# Patient Record
Sex: Male | Born: 1960 | Race: White | Hispanic: No | Smoking: Former smoker
Health system: Southern US, Community
[De-identification: ages and names within clinical notes are randomized; demographics above are authoritative.]

## PROBLEM LIST (undated history)

## (undated) DIAGNOSIS — M5416 Radiculopathy, lumbar region: Secondary | ICD-10-CM

## (undated) DIAGNOSIS — K219 Gastro-esophageal reflux disease without esophagitis: Secondary | ICD-10-CM

## (undated) DIAGNOSIS — Z8719 Personal history of other diseases of the digestive system: Secondary | ICD-10-CM

## (undated) DIAGNOSIS — M722 Plantar fascial fibromatosis: Secondary | ICD-10-CM

## (undated) DIAGNOSIS — M47816 Spondylosis without myelopathy or radiculopathy, lumbar region: Secondary | ICD-10-CM

## (undated) DIAGNOSIS — M1990 Unspecified osteoarthritis, unspecified site: Secondary | ICD-10-CM

## (undated) DIAGNOSIS — M5126 Other intervertebral disc displacement, lumbar region: Secondary | ICD-10-CM

## (undated) DIAGNOSIS — C801 Malignant (primary) neoplasm, unspecified: Secondary | ICD-10-CM

## (undated) DIAGNOSIS — Z860100 Personal history of colon polyps, unspecified: Secondary | ICD-10-CM

## (undated) DIAGNOSIS — E669 Obesity, unspecified: Secondary | ICD-10-CM

## (undated) DIAGNOSIS — G4730 Sleep apnea, unspecified: Secondary | ICD-10-CM

## (undated) DIAGNOSIS — J45909 Unspecified asthma, uncomplicated: Secondary | ICD-10-CM

## (undated) DIAGNOSIS — E538 Deficiency of other specified B group vitamins: Secondary | ICD-10-CM

## (undated) DIAGNOSIS — R0600 Dyspnea, unspecified: Secondary | ICD-10-CM

## (undated) DIAGNOSIS — E785 Hyperlipidemia, unspecified: Secondary | ICD-10-CM

## (undated) DIAGNOSIS — J302 Other seasonal allergic rhinitis: Secondary | ICD-10-CM

## (undated) DIAGNOSIS — M51369 Other intervertebral disc degeneration, lumbar region without mention of lumbar back pain or lower extremity pain: Secondary | ICD-10-CM

## (undated) DIAGNOSIS — I499 Cardiac arrhythmia, unspecified: Secondary | ICD-10-CM

## (undated) DIAGNOSIS — Z85810 Personal history of malignant neoplasm of tongue: Secondary | ICD-10-CM

## (undated) DIAGNOSIS — I739 Peripheral vascular disease, unspecified: Secondary | ICD-10-CM

## (undated) HISTORY — DX: Unspecified osteoarthritis, unspecified site: M19.90

## (undated) HISTORY — DX: Personal history of malignant neoplasm of tongue: Z85.810

## (undated) HISTORY — PX: BILATERAL CARPAL TUNNEL RELEASE: SHX6508

## (undated) HISTORY — DX: Gastro-esophageal reflux disease without esophagitis: K21.9

## (undated) HISTORY — DX: Unspecified asthma, uncomplicated: J45.909

---

## 2004-09-11 ENCOUNTER — Ambulatory Visit: Payer: Self-pay | Admitting: Surgery

## 2006-12-19 ENCOUNTER — Ambulatory Visit: Payer: Self-pay | Admitting: Unknown Physician Specialty

## 2007-02-11 ENCOUNTER — Ambulatory Visit: Payer: Self-pay | Admitting: Unknown Physician Specialty

## 2007-02-27 ENCOUNTER — Ambulatory Visit: Payer: Self-pay | Admitting: Unknown Physician Specialty

## 2010-05-17 ENCOUNTER — Ambulatory Visit: Payer: Self-pay | Admitting: Unknown Physician Specialty

## 2011-08-30 ENCOUNTER — Ambulatory Visit: Payer: Self-pay | Admitting: Unknown Physician Specialty

## 2011-09-02 LAB — PATHOLOGY REPORT

## 2012-06-15 ENCOUNTER — Ambulatory Visit: Payer: Self-pay | Admitting: Unknown Physician Specialty

## 2013-05-07 ENCOUNTER — Ambulatory Visit: Payer: Self-pay

## 2014-04-07 DIAGNOSIS — J302 Other seasonal allergic rhinitis: Secondary | ICD-10-CM | POA: Insufficient documentation

## 2014-04-07 DIAGNOSIS — M545 Low back pain, unspecified: Secondary | ICD-10-CM | POA: Insufficient documentation

## 2014-04-07 DIAGNOSIS — K219 Gastro-esophageal reflux disease without esophagitis: Secondary | ICD-10-CM | POA: Insufficient documentation

## 2014-07-01 DIAGNOSIS — M5416 Radiculopathy, lumbar region: Secondary | ICD-10-CM | POA: Insufficient documentation

## 2015-02-01 DIAGNOSIS — M5126 Other intervertebral disc displacement, lumbar region: Secondary | ICD-10-CM | POA: Insufficient documentation

## 2015-05-30 DIAGNOSIS — Z8719 Personal history of other diseases of the digestive system: Secondary | ICD-10-CM | POA: Insufficient documentation

## 2015-05-30 DIAGNOSIS — Z86010 Personal history of colonic polyps: Secondary | ICD-10-CM | POA: Insufficient documentation

## 2019-08-03 ENCOUNTER — Encounter: Payer: Self-pay | Admitting: Urology

## 2019-08-03 ENCOUNTER — Ambulatory Visit (INDEPENDENT_AMBULATORY_CARE_PROVIDER_SITE_OTHER): Payer: BC Managed Care – PPO | Admitting: Urology

## 2019-08-03 ENCOUNTER — Other Ambulatory Visit: Payer: Self-pay

## 2019-08-03 VITALS — BP 142/102 | HR 71 | Ht 66.0 in | Wt 216.0 lb

## 2019-08-03 DIAGNOSIS — N432 Other hydrocele: Secondary | ICD-10-CM | POA: Diagnosis not present

## 2019-08-03 DIAGNOSIS — E785 Hyperlipidemia, unspecified: Secondary | ICD-10-CM | POA: Insufficient documentation

## 2019-08-03 DIAGNOSIS — M722 Plantar fascial fibromatosis: Secondary | ICD-10-CM | POA: Insufficient documentation

## 2019-08-03 NOTE — Patient Instructions (Signed)
Hydrocele, Adult  A hydrocele is a collection of fluid in the loose pouch of skin that holds the testicles (scrotum). This may happen because:  The amount of fluid produced in the scrotum is not absorbed by the rest of the body.  Fluid from the abdomen fills the scrotum. Normally, the testicles develop in the abdomen then move (drop) into to the scrotum before birth. The tube that the testicles travel through usually closes after the testicles drop. If the tube does not close, fluid from the abdomen can fill the scrotum. This is less common in adults.  What are the causes?  The cause of a hydrocele in adults is usually not known. However, it may be caused by:  An injury to the scrotum.  An infection (epididymitis).  Decreased blood flow to the scrotum.  Twisting of a testicle (testicular torsion).  A birth defect.  A tumor or cancer of the testicle.  What are the signs or symptoms?  A hydrocele feels like a water-filled balloon. It may also feel heavy. Other symptoms include:  Swelling of the scrotum. The swelling may decrease when you lie down. You may also notice more swelling at night than in the morning.  Swelling of the groin.  Mild discomfort in the scrotum.  Pain. This can develop if the hydrocele was caused by infection or twisting. The larger the hydrocele, the more likely you are to have pain.  How is this diagnosed?  This condition may be diagnosed based on:  Physical exam.  Medical history.  You may also have other tests, including:  Imaging tests, such as ultrasound.  Blood or urine tests.  How is this treated?  Most hydroceles go away on their own. If you have no discomfort or pain, your health care provider may suggest close monitoring of your condition (called watch and wait or watchful waiting) until the condition goes away or symptoms develop. If treatment is needed, it may include:  Treating an underlying condition. This may include using an antibiotic medicine to treat an infection.  Surgery to  stop fluid from collecting in the scrotum.  Surgery to drain the fluid. Options include:  Needle aspiration. A needle is used to drain fluid. However, the fluid buildup will come back quickly.  Hydrocelectomy. For this procedure, an incision is made in the scrotum to remove the fluid sac.  Follow these instructions at home:  Watch the hydrocele for any changes.  Take over-the-counter and prescription medicines only as told by your health care provider.  If you were prescribed an antibiotic medicine, use it as told by your health care provider. Do not stop taking the antibiotic even if you start to feel better.  Keep all follow-up visits as told by your health care provider. This is important.  Contact a health care provider if:  You notice any changes in the hydrocele.  The swelling in your scrotum or groin gets worse.  The hydrocele becomes red, firm, painful, or tender to the touch.  You have a fever.  Get help right away if you:  Develop a lot of pain, or your pain becomes worse.  Summary  A hydrocele is a collection of fluid in the loose pouch of skin that holds the testicles (scrotum).  Hydroceles can cause swelling, discomfort, and sometimes pain.  In adults, the cause of a hydrocele usually is not known. However, it is sometimes caused by an infection or a rotation and twisting of the scrotum.  Treatment is usually not   may be given to ease the pain. This information is not intended to replace advice given to you by your health care provider. Make sure you discuss any questions you have with your health care provider. Document Released: 02/20/2010 Document Revised: 09/13/2017 Document Reviewed: 09/13/2017 Elsevier Patient Education  2020 [employer], Adult, Care After This sheet gives you information about how to care for yourself  after your procedure. Your health care provider may also give you more specific instructions. If you have problems or questions, contact your health care provider. What can I expect after the procedure? After your procedure, it is common to have mild discomfort, swelling, and bruising in the pouch that holds your testicles (scrotum). Follow these instructions at home: Bathing  Ask your health care provider when you can shower, take baths, or go swimming.  If you were told to wear an athletic support strap, take it off when you shower or take a bath. Incision care   Follow instructions from your health care provider about how to take care of your incision. Make sure you: ? Wash your hands with soap and water before you change your bandage (dressing). If soap and water are not available, use hand sanitizer. ? Change your dressing as told by your health care provider. ? Leave stitches (sutures) in place.  Check your incision and scrotum every day for signs of infection. Check for: ? More redness, swelling, or pain. ? Blood or fluid. ? Warmth. ? Pus or a bad smell. Managing pain, stiffness, and swelling  If directed, apply ice to the injured area: ? Put ice in a plastic bag. ? Place a towel between your skin and the bag. ? Leave the ice on for 20 minutes, 2-3 times per day. Driving  Do not drive for 24 hours if you were given a sedative.  Do not drive or use heavy machinery while taking prescription pain medicine.  Ask your health care provider when it is safe to drive. Activity  Do not do any activities that require great strength and energy (are vigorous) for as long as told by your health care provider.  Return to your normal activities as told by your health care provider. Ask your health care provider what activities are safe for you.  Do not lift anything that is heavier than 10 lb (4.5 kg) until your health care provider says that it is safe. General instructions  Take  over-the-counter and prescription medicines only as told by your health care provider.  Keep all follow-up visits as told by your health care provider. This is important.  If you were given an athletic support strap, wear it as told by your health care provider.  If you had a drain put in during the procedure, you will need to return for a follow-up visit to have it removed. Contact a health care provider if:  Your pain is not controlled with medicine.  You have more redness or swelling around your scrotum.  You have blood or fluid coming from your scrotum.  Your incision feels warm to the touch.  You have pus or a bad smell coming from your scrotum.  You have a fever. This information is not intended to replace advice given to you by your health care provider. Make sure you discuss any questions you have with your health care provider. Document Released: 05/24/2015 Document Revised: 08/15/2017 Document Reviewed: 06/01/2016 Elsevier Patient Education  2020 [employer].

## 2019-08-03 NOTE — Progress Notes (Signed)
08/03/19 10:23 AM   Ronne Binning 1961-04-20 [id number]  Referring provider: Sofie Hartigan, MD [place],  [place] [id number]  CC: Right hydrocele  HPI: I saw Mr. Venturino in urology clinic in consultation for a right-sided hydrocele from Dr. Ellison Hughs.  He is a 58 year old relatively healthy male with a history of back and knee pain who has had right-sided scrotal swelling since 2005.  This started after he underwent what sounds like a laparoscopic right-sided hernia repair with mesh in 2005.  It has been continued to be bothersome since that time with bulkiness and occasional discomfort and pain.  He reports he saw urologist around 10 years ago, but he never followed up with them secondary to insurance concerns.  These records are unavailable to me, and I am unable to find an operative note from 2005 regarding his hernia repair.  He denies any urinary symptoms or gross hematuria.  There are no aggravating or alleviating factors.  Severity is moderate.   PMH: Past Medical History:  Diagnosis Date  . Arthritis   . Asthma   . GERD (gastroesophageal reflux disease)   . History of tongue cancer     Surgical History: History of right hernia repair with mesh  Allergies:  Allergies  Allergen Reactions  . Penicillin V Potassium Other (See Comments)    Family History: No family history on file.  Social History:  reports that he quit smoking about 12 years ago. He has never used smokeless tobacco. He reports previous alcohol use. He reports that he does not use drugs.  ROS: Please see flowsheet from today's date for complete review of systems.  Physical Exam: BP (!) 142/102 (BP Location: Left Arm, Patient Position: Sitting, Cuff Size: Normal)   Pulse 71   Ht 5\' 6"  (1.676 m)   Wt 216 lb (98 kg)   BMI 34.86 kg/m    Constitutional:  Alert and oriented, No acute distress. Cardiovascular: No clubbing, cyanosis, or edema. Respiratory: Normal respiratory effort, no  increased work of breathing. GI: Abdomen is soft, nontender, nondistended, no abdominal masses GU: Phallus with widely patent meatus, no lesions.  Moderate right scrotal swelling consistent with hydrocele, there is some firmness and almost some tethering in the right perineum.  Left testicle 20 cc and descended without masses. Lymph: No cervical or inguinal lymphadenopathy. Skin: No rashes, bruises or suspicious lesions. Neurologic: Grossly intact, no focal deficits, moving all 4 extremities. Psychiatric: Normal mood and affect.   Assessment & Plan:   In summary, the patient is a relatively healthy 58 year old male with a likely right-sided hydrocele since undergoing a laparoscopic hernia repair on the right side with mesh in 2005.  His exam is most consistent with a hydrocele, however there is some firmness and almost tethering at the inferior aspect of the scrotum.  I recommended a scrotal ultrasound for further evaluation prior to proceeding with right-sided hydrocelectomy.  We discussed the options for management including observation, clinic aspiration, or definitive management with hydrocelectomy in the operating room.  We discussed the risks and benefits of hydrocelectomy including bleeding, infection, recurrence, pain, and swelling/bruising.  -Scrotal ultrasound to rule out recurrence of right-sided hernia, call with results -Schedule right hydrocelectomy in December  A total of 40 minutes were spent face-to-face with the patient, greater than 50% was spent in patient education, counseling, and coordination of care regarding hydrocelectomy and history of hernia repair.  [name], MD  [facility] [address], [address]  [place], [place] [address] [phone]

## 2019-08-04 ENCOUNTER — Other Ambulatory Visit: Payer: Self-pay | Admitting: Radiology

## 2019-08-04 DIAGNOSIS — N432 Other hydrocele: Secondary | ICD-10-CM

## 2019-08-09 ENCOUNTER — Other Ambulatory Visit: Payer: Self-pay | Admitting: Urology

## 2019-08-09 DIAGNOSIS — N433 Hydrocele, unspecified: Secondary | ICD-10-CM

## 2019-08-13 ENCOUNTER — Ambulatory Visit
Admission: RE | Admit: 2019-08-13 | Discharge: 2019-08-13 | Disposition: A | Discharge status: Home / Self Care | Payer: BC Managed Care – PPO | Source: Ambulatory Visit | Attending: Urology | Admitting: Urology

## 2019-08-13 ENCOUNTER — Other Ambulatory Visit: Payer: Self-pay

## 2019-08-13 DIAGNOSIS — N433 Hydrocele, unspecified: Secondary | ICD-10-CM | POA: Diagnosis not present

## 2019-08-17 ENCOUNTER — Telehealth: Payer: Self-pay

## 2019-08-17 ENCOUNTER — Other Ambulatory Visit: Payer: Self-pay | Admitting: Radiology

## 2019-08-17 DIAGNOSIS — G5603 Carpal tunnel syndrome, bilateral upper limbs: Secondary | ICD-10-CM

## 2019-08-17 DIAGNOSIS — M25462 Effusion, left knee: Secondary | ICD-10-CM

## 2019-08-17 HISTORY — DX: Effusion, left knee: M25.462

## 2019-08-17 HISTORY — DX: Carpal tunnel syndrome, bilateral upper limbs: G56.03

## 2019-08-17 NOTE — Telephone Encounter (Signed)
-----   Message from [name], MD sent at 08/16/2019  1:18 PM EST ----- US shows simple hydrocele, no other abnormalities or hernia. Keep scheduled follow up for hydrocelectomy surgery  Nickolas Madrid, MD 08/16/2019

## 2019-08-17 NOTE — Telephone Encounter (Signed)
Yes, we can do vasectomy at the same time, Ive copied Amy as well  Nickolas Madrid, MD 08/17/2019

## 2019-08-17 NOTE — Telephone Encounter (Signed)
Called pt informed him of the information below. Pt gave verbal understanding. Pt also questions if a vasectomy would be able to be performed at the time of hydrocelectomy. Please advise

## 2019-08-17 NOTE — Telephone Encounter (Signed)
Called pt informed him of information below. Pt gave verbal understanding.  

## 2019-08-23 ENCOUNTER — Other Ambulatory Visit: Payer: Self-pay | Admitting: Orthopedic Surgery

## 2019-08-23 ENCOUNTER — Other Ambulatory Visit: Payer: Self-pay | Admitting: Radiology

## 2019-08-23 DIAGNOSIS — M25562 Pain in left knee: Secondary | ICD-10-CM

## 2019-08-23 DIAGNOSIS — M1712 Unilateral primary osteoarthritis, left knee: Secondary | ICD-10-CM

## 2019-08-23 DIAGNOSIS — M25362 Other instability, left knee: Secondary | ICD-10-CM

## 2019-08-23 DIAGNOSIS — M2392 Unspecified internal derangement of left knee: Secondary | ICD-10-CM

## 2019-08-23 DIAGNOSIS — M25462 Effusion, left knee: Secondary | ICD-10-CM

## 2019-08-23 DIAGNOSIS — G8929 Other chronic pain: Secondary | ICD-10-CM

## 2019-08-24 ENCOUNTER — Encounter
Admission: RE | Admit: 2019-08-24 | Discharge: 2019-08-24 | Disposition: A | Discharge status: Home / Self Care | Payer: BC Managed Care – PPO | Source: Ambulatory Visit | Attending: Urology | Admitting: Urology

## 2019-08-24 ENCOUNTER — Encounter: Payer: Self-pay | Admitting: *Deleted

## 2019-08-24 ENCOUNTER — Other Ambulatory Visit: Payer: Self-pay

## 2019-08-24 HISTORY — DX: Personal history of other diseases of the digestive system: Z87.19

## 2019-08-24 HISTORY — DX: Spondylosis without myelopathy or radiculopathy, lumbar region: M47.816

## 2019-08-24 NOTE — Patient Instructions (Signed)
INSTRUCTIONS FOR SURGERY     Your surgery is scheduled for:   Friday, December 18TH     To find out your arrival time for the day of surgery,          please call [phone] between 1 pm and 3 pm on :   Thursday, December 17TH      When you arrive for surgery, report to the [facility].       Do NOT stop on the first floor to register.    REMEMBER: Instructions that are not followed completely may result in serious medical risk,  up to and including death, or upon the discretion of your surgeon and anesthesiologist,            your surgery may need to be rescheduled.  __X__ 1. Do not eat food after midnight the night before your procedure.                    No gum, candy, lozenger, tic tacs, tums or hard candies.                  ABSOLUTELY NOTHING SOLID IN YOUR MOUTH AFTER MIDNIGHT                    You may drink unlimited clear liquids up to 2 hours before you are scheduled to arrive for surgery.                   Do not drink anything within those 2 hours unless you need to take medicine, then take the                   smallest amount you need.  Clear liquids include:  water, apple juice without pulp,                   any flavor Gatorade, Black coffee, black tea.  Sugar may be added but no dairy/ honey /lemon.                        Broth and jello is not considered a clear liquid.  __x__  2. On the morning of surgery, please brush your teeth with toothpaste and water. You may rinse with                  mouthwash if you wish but DO NOT SWALLOW TOOTHPASTE OR MOUTHWASH  __X___3. NO alcohol for 24 hours before or after surgery.  __x___ 4.  Do NOT smoke or use e-cigarettes for 24 HOURS PRIOR TO SURGERY.                      DO NOT use any chewable tobacco products for at least 6 hours prior to surgery.  __x___ 5. If you start any new medication after this appointment and prior to surgery, please             Bring it with you on the day of surgery.  ___x__ 6. Notify your doctor if there is any change in your medical condition, such as  fever, infection, vomitting,                   Diarrhea or any open sores.  __x___ 7.  USE the CHG SOAP as instructed, the night before surgery and the day of surgery.                   Once you have washed with this soap, do NOT use any of the following: Powders, perfumes                    or lotions. Please do not wear make up, hairpins, clips or nail polish. You may wear deodorant.                   Men may shave their face and neck.  Women need to shave 48 hours prior to surgery.                   DO NOT wear ANY jewelry on the day of surgery. If there are rings that are too tight to                    remove easily, please address this prior to the surgery day. Piercings need to be removed.                                                                     NO METAL ON YOUR BODY.                    Do NOT bring any valuables.  If you came to Pre-Admit testing then you will not need license,                     insurance card or credit card.  If you will be staying overnight, please either leave your things in                     the car or have your family be responsible for these items.                     [facility] IS NOT RESPONSIBLE FOR BELONGINGS OR VALUABLES.  ___X__ 8. DO NOT wear contact lenses on surgery day.  You may not have dentures,                     Hearing aides, contacts or glasses in the operating room. These items can be                    Placed in the Recovery Room to receive immediately after surgery.  __x___ 9. IF YOU ARE SCHEDULED TO GO HOME ON THE SAME DAY, YOU MUST                   Have someone to drive you home and to stay with you  for the first 24 hours.                    Have an arrangement prior to arriving on surgery day.  ___x__ 10. Take the following medications on the morning of surgery with  a sip of water:                               1.  TRAMADOL, if you need it                     2.   PRILOSEC                     3.   ZYRTEC                     4.                     5.                _____ 11.  Follow any instructions provided to you by your surgeon.                        Such as enema, clear liquid bowel prep  __X__  12. STOP  ALL ASPIRIN PRODUCTS AS OF December 11TH                        THIS INCLUDES BC POWDERS / GOODIES POWDER  __x___ 13. STOP Anti-inflammatories as of: December 11TH                      This includes IBUPROFEN / MOTRIN / ADVIL / ALEVE/ NAPROXYN                    YOU MAY TAKE TYLENOL ANY TIME PRIOR TO SURGERY.  _____ 50.  Stop supplements until after surgery.                     This includes: N/A                 You may continue taking Vitamin B12 / Vitamin D3 but do not take on the morning of surgery.  ______18. If staying overnight, please have appropriate shoes to wear to be able to walk around the unit.                   Wear clean and comfortable clothing to the hospital.  [place] SOMEONE WITH YOU FOR 24 HOURS AFTER SURGERY.  HAVE STOOL SOFTENERS AVAILABLE AT HOME TO USE AFTER SURGERY.  YOU DO NOT WANT TO STRAIN.  WEAR SOMETHING LOOSE AND COMFORTABLE TO THE HOSPITAL.

## 2019-08-24 NOTE — Patient Instructions (Signed)
INSTRUCTIONS FOR SURGERY     Your surgery is scheduled for:   Friday, December 18TH     To find out your arrival time for the day of surgery,          please call [phone] between 1 pm and 3 pm on :  Thursday, December 17TH     When you arrive for surgery, report to the [facility].       Do NOT stop on the first floor to register.    REMEMBER: Instructions that are not followed completely may result in serious medical risk,  up to and including death, or upon the discretion of your surgeon and anesthesiologist,            your surgery may need to be rescheduled.  __X__ 1. Do not eat food after midnight the night before your procedure.                    No gum, candy, lozenger, tic tacs, tums or hard candies.                  ABSOLUTELY NOTHING SOLID IN YOUR MOUTH AFTER MIDNIGHT                    You may drink unlimited clear liquids up to 2 hours before you are scheduled to arrive for surgery.                   Do not drink anything within those 2 hours unless you need to take medicine, then take the                   smallest amount you need.  Clear liquids include:  water, apple juice without pulp,                   any flavor Gatorade, Black coffee, black tea.  Sugar may be added but no dairy/ honey /lemon.                        Broth and jello is not considered a clear liquid.  __x__  2. On the morning of surgery, please brush your teeth with toothpaste and water. You may rinse with                  mouthwash if you wish but DO NOT SWALLOW TOOTHPASTE OR MOUTHWASH  __X___3. NO alcohol for 24 hours before or after surgery.  __x___ 4.  Do NOT smoke or use e-cigarettes for 24 HOURS PRIOR TO SURGERY.                      DO NOT Use any chewable tobacco products for at least 6 hours prior to surgery.  __x___ 5. If you start any new medication after this appointment and prior to surgery, please           Bring it with you on the day of surgery.  ___x__ 6. Notify your doctor if there is any change in your medical condition, such as fever, infection, vomitting,  Diarrhea or any open sores.  __x___ 7.  USE the CHG SOAP as instructed, the night before surgery and the day of surgery.                   Once you have washed with this soap, do NOT use any of the following: Powders, perfumes                    or lotions. Please do not wear make up, hairpins, clips or nail polish. You MAY wear deodorant.                   Men may shave their face and neck.  Women need to shave 48 hours prior to surgery.                   DO NOT wear ANY jewelry on the day of surgery. If there are rings that are too tight to                    remove easily, please address this prior to the surgery day. Piercings need to be removed.                                                                     NO METAL ON YOUR BODY.                    Do NOT bring any valuables.  If you came to Pre-Admit testing then you will not need license,                     insurance card or credit card.  If you will be staying overnight, please either leave your things in                     the car or have your family be responsible for these items.                     [facility] IS NOT RESPONSIBLE FOR BELONGINGS OR VALUABLES.  ___X__ 8. DO NOT wear contact lenses on surgery day.  You may not have dentures,                     Hearing aides, contacts or glasses in the operating room. These items can be                    Placed in the Recovery Room to receive immediately after surgery.  __x___ 9. IF YOU ARE SCHEDULED TO GO HOME ON THE SAME DAY, YOU MUST                   Have someone to drive you home and to stay with you  for the first 24 hours.                    Have an arrangement prior to arriving on surgery day.  ___x__ 10. Take the following medications on the morning of surgery with a sip of water:  1.PRILOSEC                     2.ZYRTEC                     3.TRAMADOL, IF NEEDED                     4.                 _____ 11.  Follow any instructions provided to you by your surgeon.                        Such as enema, clear liquid bowel prep  __X__  12. STOP ALL ASPIRIN PRODUCTS AS OF December 11TH                       THIS INCLUDES BC POWDERS / GOODIES POWDER  __x___ 13. STOP Anti-inflammatories as of: December 11TH                      This includes IBUPROFEN / MOTRIN / ADVIL / ALEVE/ NAPROXYN                    YOU MAY TAKE TYLENOL ANY TIME PRIOR TO SURGERY.  _____ 23.  Stop supplements until after surgery.                     This includes:                 You may continue taking Vitamin B12 / Vitamin D3 but do not take on the morning of surgery.  PLEASE WEAR LOOSE AND COMFORTABLE CLOTHING TO [place]. HAVE STOOL SOFTENERS AVAILABLE ONCE HOME.  YOU DO NOT WANT TO STRAIN IF YOU BECOME CONSTIPATED.  MAKE SURE YOU HAVE SOMEONE TO STAY WITH YOU FOR 24 HOURS. BRING PHONE NUMBERS OF DRIVER AND CONTACT PERSON.

## 2019-08-31 ENCOUNTER — Other Ambulatory Visit
Admission: RE | Admit: 2019-08-31 | Discharge: 2019-08-31 | Disposition: A | Discharge status: Home / Self Care | Payer: BC Managed Care – PPO | Source: Ambulatory Visit | Attending: Urology | Admitting: Urology

## 2019-08-31 DIAGNOSIS — Z01812 Encounter for preprocedural laboratory examination: Secondary | ICD-10-CM | POA: Diagnosis not present

## 2019-08-31 DIAGNOSIS — Z20828 Contact with and (suspected) exposure to other viral communicable diseases: Secondary | ICD-10-CM | POA: Diagnosis not present

## 2019-08-31 LAB — SARS CORONAVIRUS 2 (TAT 6-24 HRS): SARS Coronavirus 2: NEGATIVE

## 2019-09-02 ENCOUNTER — Ambulatory Visit
Admission: RE | Admit: 2019-09-02 | Discharge: 2019-09-02 | Disposition: A | Discharge status: Home / Self Care | Payer: BC Managed Care – PPO | Source: Ambulatory Visit | Attending: Orthopedic Surgery | Admitting: Orthopedic Surgery

## 2019-09-02 ENCOUNTER — Other Ambulatory Visit: Payer: Self-pay

## 2019-09-02 DIAGNOSIS — M2392 Unspecified internal derangement of left knee: Secondary | ICD-10-CM | POA: Diagnosis present

## 2019-09-02 DIAGNOSIS — M25562 Pain in left knee: Secondary | ICD-10-CM | POA: Insufficient documentation

## 2019-09-02 DIAGNOSIS — M25362 Other instability, left knee: Secondary | ICD-10-CM | POA: Insufficient documentation

## 2019-09-02 DIAGNOSIS — M1712 Unilateral primary osteoarthritis, left knee: Secondary | ICD-10-CM | POA: Diagnosis present

## 2019-09-02 DIAGNOSIS — M25462 Effusion, left knee: Secondary | ICD-10-CM | POA: Insufficient documentation

## 2019-09-02 DIAGNOSIS — G8929 Other chronic pain: Secondary | ICD-10-CM | POA: Diagnosis present

## 2019-09-03 ENCOUNTER — Ambulatory Visit: Payer: BC Managed Care – PPO | Admitting: Anesthesiology

## 2019-09-03 ENCOUNTER — Encounter
Admission: RE | Disposition: A | Discharge status: Home / Self Care | Payer: Self-pay | Source: Home / Self Care | Attending: Urology

## 2019-09-03 ENCOUNTER — Ambulatory Visit
Admission: RE | Admit: 2019-09-03 | Discharge: 2019-09-03 | Disposition: A | Discharge status: Home / Self Care | Payer: BC Managed Care – PPO | Attending: Urology | Admitting: Urology

## 2019-09-03 ENCOUNTER — Encounter: Payer: Self-pay | Admitting: Urology

## 2019-09-03 DIAGNOSIS — K219 Gastro-esophageal reflux disease without esophagitis: Secondary | ICD-10-CM | POA: Diagnosis not present

## 2019-09-03 DIAGNOSIS — Z302 Encounter for sterilization: Secondary | ICD-10-CM | POA: Diagnosis not present

## 2019-09-03 DIAGNOSIS — N433 Hydrocele, unspecified: Secondary | ICD-10-CM | POA: Diagnosis not present

## 2019-09-03 DIAGNOSIS — Z87891 Personal history of nicotine dependence: Secondary | ICD-10-CM | POA: Insufficient documentation

## 2019-09-03 DIAGNOSIS — M1990 Unspecified osteoarthritis, unspecified site: Secondary | ICD-10-CM | POA: Insufficient documentation

## 2019-09-03 DIAGNOSIS — Z880 Allergy status to penicillin: Secondary | ICD-10-CM | POA: Insufficient documentation

## 2019-09-03 DIAGNOSIS — N432 Other hydrocele: Secondary | ICD-10-CM

## 2019-09-03 DIAGNOSIS — Z85810 Personal history of malignant neoplasm of tongue: Secondary | ICD-10-CM | POA: Diagnosis not present

## 2019-09-03 DIAGNOSIS — N430 Encysted hydrocele: Secondary | ICD-10-CM | POA: Diagnosis not present

## 2019-09-03 HISTORY — PX: HYDROCELE EXCISION: SHX482

## 2019-09-03 HISTORY — PX: VASECTOMY: SHX75

## 2019-09-03 HISTORY — DX: Malignant (primary) neoplasm, unspecified: C80.1

## 2019-09-03 SURGERY — HYDROCELECTOMY
Anesthesia: General | Site: Scrotum | Laterality: Right

## 2019-09-03 MED ORDER — LIDOCAINE HCL 1 % IJ SOLN
INTRAMUSCULAR | Status: DC | PRN
  Administered 2019-09-03: 10 mL

## 2019-09-03 MED ORDER — BACITRACIN ZINC 500 UNIT/GM EX OINT
TOPICAL_OINTMENT | CUTANEOUS | Status: DC | PRN
  Administered 2019-09-03: 1 via TOPICAL

## 2019-09-03 MED ORDER — EPHEDRINE SULFATE 50 MG/ML IJ SOLN
INTRAMUSCULAR | Status: DC | PRN
  Administered 2019-09-03 (×2): 5 mg via INTRAVENOUS

## 2019-09-03 MED ORDER — CEFAZOLIN SODIUM-DEXTROSE 2-4 GM/100ML-% IV SOLN: 2.0000 g | INTRAVENOUS | Status: DC

## 2019-09-03 MED ORDER — ONDANSETRON HCL 4 MG/2ML IJ SOLN: 4.0000 mg | Freq: Once | INTRAMUSCULAR | Status: DC | PRN

## 2019-09-03 MED ORDER — EPHEDRINE SULFATE 50 MG/ML IJ SOLN
INTRAMUSCULAR | Status: AC
  Filled 2019-09-03: qty 1

## 2019-09-03 MED ORDER — PROPOFOL 10 MG/ML IV BOLUS
INTRAVENOUS | Status: AC
  Filled 2019-09-03: qty 20

## 2019-09-03 MED ORDER — PROPOFOL 10 MG/ML IV BOLUS
INTRAVENOUS | Status: DC | PRN
  Administered 2019-09-03: 200 mg via INTRAVENOUS
  Administered 2019-09-03: 30 mg via INTRAVENOUS

## 2019-09-03 MED ORDER — ONDANSETRON HCL 4 MG/2ML IJ SOLN
INTRAMUSCULAR | Status: DC | PRN
  Administered 2019-09-03: 4 mg via INTRAVENOUS

## 2019-09-03 MED ORDER — FENTANYL CITRATE (PF) 100 MCG/2ML IJ SOLN
INTRAMUSCULAR | Status: AC
  Filled 2019-09-03: qty 2

## 2019-09-03 MED ORDER — MIDAZOLAM HCL 2 MG/2ML IJ SOLN
INTRAMUSCULAR | Status: DC | PRN
  Administered 2019-09-03: 2 mg via INTRAVENOUS

## 2019-09-03 MED ORDER — DEXAMETHASONE SODIUM PHOSPHATE 10 MG/ML IJ SOLN
INTRAMUSCULAR | Status: DC | PRN
  Administered 2019-09-03: 10 mg via INTRAVENOUS

## 2019-09-03 MED ORDER — LIDOCAINE HCL (PF) 1 % IJ SOLN
INTRAMUSCULAR | Status: AC
  Filled 2019-09-03: qty 30

## 2019-09-03 MED ORDER — FENTANYL CITRATE (PF) 100 MCG/2ML IJ SOLN
INTRAMUSCULAR | Status: DC | PRN
  Administered 2019-09-03 (×2): 50 ug via INTRAVENOUS

## 2019-09-03 MED ORDER — ONDANSETRON HCL 4 MG/2ML IJ SOLN
INTRAMUSCULAR | Status: AC
  Filled 2019-09-03: qty 2

## 2019-09-03 MED ORDER — LACTATED RINGERS IV SOLN: INTRAVENOUS | Status: DC

## 2019-09-03 MED ORDER — PHENYLEPHRINE HCL (PRESSORS) 10 MG/ML IV SOLN
INTRAVENOUS | Status: DC | PRN
  Administered 2019-09-03 (×4): 100 ug via INTRAVENOUS

## 2019-09-03 MED ORDER — HYDROCODONE-ACETAMINOPHEN 5-325 MG PO TABS: 1.0000 | ORAL_TABLET | ORAL | 0 refills | Status: DC | PRN

## 2019-09-03 MED ORDER — FENTANYL CITRATE (PF) 100 MCG/2ML IJ SOLN
25.0000 ug | INTRAMUSCULAR | Status: DC | PRN
  Administered 2019-09-03: 25 ug via INTRAVENOUS

## 2019-09-03 MED ORDER — LIDOCAINE HCL (CARDIAC) PF 100 MG/5ML IV SOSY
PREFILLED_SYRINGE | INTRAVENOUS | Status: DC | PRN
  Administered 2019-09-03: 100 mg via INTRAVENOUS

## 2019-09-03 MED ORDER — CEFAZOLIN SODIUM-DEXTROSE 2-4 GM/100ML-% IV SOLN
INTRAVENOUS | Status: AC
  Filled 2019-09-03: qty 100

## 2019-09-03 MED ORDER — MIDAZOLAM HCL 2 MG/2ML IJ SOLN
INTRAMUSCULAR | Status: AC
  Filled 2019-09-03: qty 2

## 2019-09-03 MED ORDER — BACITRACIN ZINC 500 UNIT/GM EX OINT
TOPICAL_OINTMENT | CUTANEOUS | Status: AC
  Filled 2019-09-03: qty 28.35

## 2019-09-03 SURGICAL SUPPLY — 42 items
BLADE CLIPPER SURG (BLADE) ×3 IMPLANT
BLADE SURG 15 STRL LF DISP TIS (BLADE) ×2 IMPLANT
BLADE SURG 15 STRL SS (BLADE) ×1
CANISTER SUCT 1200ML W/VALVE (MISCELLANEOUS) ×3 IMPLANT
CHLORAPREP W/TINT 26 (MISCELLANEOUS) ×3 IMPLANT
COVER WAND RF STERILE (DRAPES) ×3 IMPLANT
DERMABOND ADVANCED (GAUZE/BANDAGES/DRESSINGS) ×1
DERMABOND ADVANCED .7 DNX12 (GAUZE/BANDAGES/DRESSINGS) ×2 IMPLANT
DRAIN PENROSE 1/4X12 LTX STRL (WOUND CARE) IMPLANT
DRAPE LAPAROTOMY 77X122 PED (DRAPES) ×3 IMPLANT
DRSG GAUZE FLUFF 36X18 (GAUZE/BANDAGES/DRESSINGS) ×3 IMPLANT
DRSG TELFA 4X3 1S NADH ST (GAUZE/BANDAGES/DRESSINGS) ×3 IMPLANT
ELECT CAUTERY NEEDLE TIP 1.0 (MISCELLANEOUS) ×3
ELECT REM PT RETURN 9FT ADLT (ELECTROSURGICAL) ×3
ELECTRODE CAUTERY NEDL TIP 1.0 (MISCELLANEOUS) ×2 IMPLANT
ELECTRODE REM PT RTRN 9FT ADLT (ELECTROSURGICAL) ×2 IMPLANT
GAUZE SPONGE 4X4 12PLY STRL (GAUZE/BANDAGES/DRESSINGS) IMPLANT
GLOVE BIOGEL PI IND STRL 7.5 (GLOVE) ×2 IMPLANT
GLOVE BIOGEL PI INDICATOR 7.5 (GLOVE) ×1
GOWN STRL REUS W/ TWL LRG LVL3 (GOWN DISPOSABLE) ×2 IMPLANT
GOWN STRL REUS W/ TWL XL LVL3 (GOWN DISPOSABLE) ×2 IMPLANT
GOWN STRL REUS W/TWL LRG LVL3 (GOWN DISPOSABLE) ×1
GOWN STRL REUS W/TWL XL LVL3 (GOWN DISPOSABLE) ×1
KIT TURNOVER KIT A (KITS) ×3 IMPLANT
LABEL OR SOLS (LABEL) ×3 IMPLANT
NDL HYPO 25X1 1.5 SAFETY (NEEDLE) ×2 IMPLANT
NEEDLE HYPO 25X1 1.5 SAFETY (NEEDLE) ×3 IMPLANT
NS IRRIG 500ML POUR BTL (IV SOLUTION) ×3 IMPLANT
PACK BASIN MINOR ARMC (MISCELLANEOUS) ×3 IMPLANT
SUCTION FRAZIER HANDLE 10FR (MISCELLANEOUS) ×1
SUCTION TUBE FRAZIER 10FR DISP (MISCELLANEOUS) ×2 IMPLANT
SUPPORETR ATHLETIC LG (MISCELLANEOUS) ×2 IMPLANT
SUPPORT SCROTAL LG STRP (MISCELLANEOUS) ×3 IMPLANT
SUPPORTER ATHLETIC LG (MISCELLANEOUS) ×3
SUT CHROMIC 3 0 PS 2 (SUTURE) ×3 IMPLANT
SUT CHROMIC 4 0 RB 1X27 (SUTURE) IMPLANT
SUT ETHILON 3-0 FS-10 30 BLK (SUTURE)
SUT ETHILON NAB PS2 4-0 18IN (SUTURE) IMPLANT
SUT VIC AB 3-0 SH 27 (SUTURE) ×2
SUT VIC AB 3-0 SH 27X BRD (SUTURE) ×4 IMPLANT
SUTURE EHLN 3-0 FS-10 30 BLK (SUTURE) IMPLANT
SYR 10ML LL (SYRINGE) ×3 IMPLANT

## 2019-09-03 NOTE — H&P (Signed)
09/03/19 1:32 PM   Dylan Hendrix Apr 07, 1961 [id number]   HPI: I saw Dylan Hendrix in consultation for a right-sided hydrocele from Dr. Ellison Hughs.  He is a 58 year old relatively healthy male with a history of back and knee pain who has had right-sided scrotal swelling since 2005.  This started after he underwent what sounds like a laparoscopic right-sided hernia repair with mesh in 2005.  It has been continued to be bothersome since that time with bulkiness and occasional discomfort and pain.  He reports he saw urologist around 10 years ago, but he never followed up with them secondary to insurance concerns.  These records are unavailable to me, and I am unable to find an operative note from 2005 regarding his hernia repair.  He denies any urinary symptoms or gross hematuria.  There are no aggravating or alleviating factors.  Severity is moderate.  He presents today for right sided hydrocele repair, and bilateral vasectomy.   PMH: Past Medical History:  Diagnosis Date  . Arthritis   . Asthma    AS A CHILD  . Bilateral carpal tunnel syndrome 08/2019   patient to have right hand release in office on 09/01/19  . Cancer ([place])   . Effusion of knee joint, left 08/2019  . GERD (gastroesophageal reflux disease)   . History of Barrett's esophagus   . History of tongue cancer   . Spondylosis of lumbar region without myelopathy or radiculopathy     Surgical History: Past Surgical History:  Procedure Laterality Date  . HERNIA REPAIR    . TONGUE SURGERY  2002   cancer of tongue removed.  reoccurence and repeat removal in 2008.    Allergies:  Allergies  Allergen Reactions  . Penicillin V Potassium Other (See Comments)    Told had allergy as a child. Doesn't know what the reaction was.    Family History: History reviewed. No pertinent family history.  Social History:  reports that he quit smoking about 12 years ago. His smoking use included cigarettes. He has never used smokeless  tobacco. He reports previous alcohol use. He reports that he does not use drugs.  ROS: Please see flowsheet from today's date for complete review of systems.  Physical Exam: BP (!) 126/97   Pulse 75   Temp 98.7 F (37.1 C) (Tympanic)   Resp 14   Ht 5\' 6"  (1.676 m)   Wt 98 kg   SpO2 100%   BMI 34.87 kg/m    Constitutional:  Alert and oriented, No acute distress. Cardiovascular: Regular rate and rhythm Respiratory: Clear to auscultation bilaterally GI: Abdomen is soft, nontender, nondistended, no abdominal masses GU: Moderate sized right hydrocele Lymph: No cervical or inguinal lymphadenopathy. Skin: No rashes, bruises or suspicious lesions. Neurologic: Grossly intact, no focal deficits, moving all 4 extremities. Psychiatric: Normal mood and affect.   Assessment & Plan:   In summary, the patient is a 58 year old male with a moderate size right hydrocele and desire for vasectomy.  We discussed the risks and benefits of hydrocelectomy and vasectomy at length.  Vasectomy is intended to be a permanent form of contraception, and does not produce immediate sterility.  Following vasectomy another form of contraception is required until vas occlusion is confirmed by a post-vasectomy semen analysis obtained 2-3 months after the procedure.  Even after vas occlusion is confirmed, vasectomy is not 100% reliable in preventing pregnancy, and the failure rate is approximately 09/1998.  Repeat vasectomy is required in less than 1% of patients.  He should refrain from ejaculation for 1 week after vasectomy.  Options for fertility after vasectomy include vasectomy reversal, and sperm retrieval with in vitro fertilization or ICSI.  These options are not always successful and may be expensive.  Finally, there are other permanent and non-permanent alternatives to vasectomy available. There is no risk of erectile dysfunction, and the volume of semen will be similar to prior, as the majority of the ejaculate is  from the prostate and seminal vesicles.   The complication rate is approximately 1-2%, and includes bleeding, infection, and development of chronic scrotal pain.  We also discussed the risk of hydrocele recurrence, especially after his hernia repair.  Vasectomy and right hydrocelectomy  Dylan Hendrix, [facility] [address], [place], [place] [address] [phone]

## 2019-09-03 NOTE — Anesthesia Post-op Follow-up Note (Signed)
Anesthesia QCDR form completed.        

## 2019-09-03 NOTE — Discharge Instructions (Signed)
AMBULATORY SURGERY  DISCHARGE INSTRUCTIONS   1) The drugs that you were given will stay in your system until tomorrow so for the next 24 hours you should not:  A) Drive an automobile B) Make any legal decisions C) Drink any alcoholic beverage   2) You may resume regular meals tomorrow.  Today it is better to start with liquids and gradually work up to solid foods.  You may eat anything you prefer, but it is better to start with liquids, then soup and crackers, and gradually work up to solid foods.   3) Please notify your doctor immediately if you have any unusual bleeding, trouble breathing, redness and pain at the surgery site, drainage, fever, or pain not relieved by medication.    4) Additional Instructions:   Change fluffs as needed.  Continue use of scrotal support.  Ice for comfort.   Hydrocelectomy, Adult, Care After This sheet gives you information about how to care for yourself after your procedure. Your health care provider may also give you more specific instructions. If you have problems or questions, contact your health care provider. What can I expect after the procedure? After your procedure, it is common to have mild discomfort, swelling, and bruising in the pouch that holds your testicles (scrotum). Follow these instructions at home: Bathing  Ask your health care provider when you can shower, take baths, or go swimming.  If you were told to wear an athletic support strap, take it off when you shower or take a bath. Incision care   Follow instructions from your health care provider about how to take care of your incision. Make sure you: ? Wash your hands with soap and water before you change your bandage (dressing). If soap and water are not available, use hand sanitizer. ? Change your dressing as told by your health care provider. ? Leave stitches (sutures) in place.  Check your incision and scrotum every day for signs of infection. Check for: ? More  redness, swelling, or pain. ? Blood or fluid. ? Warmth. ? Pus or a bad smell. Managing pain, stiffness, and swelling  If directed, apply ice to the injured area: ? Put ice in a plastic bag. ? Place a towel between your skin and the bag. ? Leave the ice on for 20 minutes, 2-3 times per day. Driving  Do not drive for 24 hours if you were given a sedative.  Do not drive or use heavy machinery while taking prescription pain medicine.  Ask your health care provider when it is safe to drive. Activity  Do not do any activities that require great strength and energy (are vigorous) for as long as told by your health care provider.  Return to your normal activities as told by your health care provider. Ask your health care provider what activities are safe for you.  Do not lift anything that is heavier than 10 lb (4.5 kg) until your health care provider says that it is safe. General instructions  Take over-the-counter and prescription medicines only as told by your health care provider.  Keep all follow-up visits as told by your health care provider. This is important.  If you were given an athletic support strap, wear it as told by your health care provider.  If you had a drain put in during the procedure, you will need to return for a follow-up visit to have it removed. Contact a health care provider if:  Your pain is not controlled with medicine.  You  have more redness or swelling around your scrotum.  You have blood or fluid coming from your scrotum.  Your incision feels warm to the touch.  You have pus or a bad smell coming from your scrotum.  You have a fever. This information is not intended to replace advice given to you by your health care provider. Make sure you discuss any questions you have with your health care provider. Document Released: 05/24/2015 Document Revised: 08/15/2017 Document Reviewed: 06/01/2016 Elsevier Patient Education  2020 [place].   Please contact your physician with any problems or Same Day Surgery at [phone], Monday through Friday 6 am to 4 pm, or [place] number at [phone].

## 2019-09-03 NOTE — Anesthesia Preprocedure Evaluation (Addendum)
Anesthesia Evaluation  Patient identified by MRN, date of birth, ID band Patient awake    Reviewed: Allergy & Precautions, H&P , NPO status , Patient's Chart, lab work & pertinent test results  Airway Mallampati: II  TM Distance: >3 FB Neck ROM: full    Dental  (+) Teeth Intact   Pulmonary asthma , neg COPD, neg recent URI, Not current smoker, former smoker,           Cardiovascular (-) angina(-) Past MI and (-) Cardiac Stents negative cardio ROS  (-) dysrhythmias      Neuro/Psych negative neurological ROS  negative psych ROS   GI/Hepatic Neg liver ROS, GERD  Controlled,  Endo/Other  negative endocrine ROS  Renal/GU      Musculoskeletal  (+) Arthritis ,   Abdominal   Peds  Hematology negative hematology ROS (+)   Anesthesia Other Findings Past Medical History: No date: Arthritis No date: Asthma     Comment:  AS A CHILD 08/2019: Bilateral carpal tunnel syndrome     Comment:  patient to have right hand release in office on 09/01/19 No date: Cancer ([place]) 08/2019: Effusion of knee joint, left No date: GERD (gastroesophageal reflux disease) No date: History of Barrett's esophagus No date: History of tongue cancer No date: Spondylosis of lumbar region without myelopathy or  radiculopathy  Past Surgical History: No date: HERNIA REPAIR 2002: TONGUE SURGERY     Comment:  cancer of tongue removed.  reoccurence and repeat               removal in 2008.  BMI    Body Mass Index: 34.87 kg/m      Reproductive/Obstetrics negative OB ROS                            Anesthesia Physical Anesthesia Plan  ASA: II  Anesthesia Plan: General LMA   Post-op Pain Management:    Induction:   PONV Risk Score and Plan: Dexamethasone, Ondansetron, Midazolam and Treatment may vary due to age or medical condition  Airway Management Planned:   Additional Equipment:   Intra-op Plan:    Post-operative Plan:   Informed Consent: I have reviewed the patients History and Physical, chart, labs and discussed the procedure including the risks, benefits and alternatives for the proposed anesthesia with the patient or authorized representative who has indicated his/her understanding and acceptance.     Dental Advisory Given  Plan Discussed with: Anesthesiologist  Anesthesia Plan Comments:         Anesthesia Quick Evaluation

## 2019-09-03 NOTE — Op Note (Signed)
Date of procedure: 09/03/19  Preoperative diagnosis:  1. Right hydrocele, desire for surgical sterilization  Postoperative diagnosis:  1. Same  Procedure: 1. Right hydrocelectomy 2. Bilateral vasectomy  Surgeon: Nickolas Madrid, MD  Anesthesia: General  Complications: None  Intraoperative findings:  1.  Significant scarring and fibrotic tissue surrounding the right hydrocele 2.  Uncomplicated bilateral vasectomy  EBL: 10 mL   Specimens: None  Drains: None  Indication: Dylan Hendrix is a 58 y.o. patient with right hydrocele for at least 10 years who also desires surgical sterilization.  After reviewing the management options for treatment, they elected to proceed with the above surgical procedure(s). We have discussed the potential benefits and risks of the procedure, side effects of the proposed treatment, the likelihood of the patient achieving the goals of the procedure, and any potential problems that might occur during the procedure or recuperation. Informed consent has been obtained.  Description of procedure:  The patient was taken to the operating room and general anesthesia was induced. SCDs were placed for DVT prophylaxis. The patient was placed in the supine position, prepped and draped in the usual sterile fashion, and preoperative antibiotics(Ancef) were administered. A preoperative time-out was performed.   I started by making a small incision in the left scrotum using the no scalpel vasectomy tool.  The left vas deferens was brought up to the skin, 1 cm segment was removed, cautery used to burn each end, and a single chromic stitch placed in the skin.  A 4 cm incision was made in the right hemiscrotum.  I dissected down to the hydrocele sac and bluntly dissected the hydrocele sac free from the surrounding dartos.  There was significant fibrotic tissue and scarring and the planes were grossly abnormal and challenging. A small incision was made in the hydrocele sac into  50 cc of clear fluid drained.  The testicle was delivered through the incision and the a section of the fibrotic hydrocele sac was excised.  The tissue was too fibrotic to perform a Cytogeneticist. I identified the right vas deferens and a 1 cm segment was divided and each end cauterized.  Meticulous hemostasis was achieved, and the scrotum was irrigated copiously.  A 3-0 Vicryl was used to close the dartos, and interrupted 3-0 chromic were used to close the skin.  10 cc of local anesthetic were injected.   Disposition: Stable to PACU  Plan: PACU, discharge today Semen sample in 3 months to confirm negative  Nickolas Madrid, MD

## 2019-09-03 NOTE — Transfer of Care (Signed)
Immediate Anesthesia Transfer of Care Note  Patient: Dylan Hendrix  Procedure(s) Performed: HYDROCELECTOMY ADULT (Right Scrotum) VASECTOMY (Bilateral Perineum)  Patient Location: PACU  Anesthesia Type:General  Level of Consciousness: drowsy  Airway & Oxygen Therapy: Patient Spontanous Breathing and Patient connected to face mask oxygen  Post-op Assessment: Report given to RN and Post -op Vital signs reviewed and stable  Post vital signs: Reviewed and stable  Last Vitals:  Vitals Value Taken Time  BP 125/87 09/03/19 1506  Temp 36.7 C 09/03/19 1505  Pulse 74 09/03/19 1510  Resp 11 09/03/19 1510  SpO2 100 % 09/03/19 1510  Vitals shown include unvalidated device data.  Last Pain:  Vitals:   09/03/19 1300  TempSrc: Tympanic  PainSc: 4       Patients Stated Pain Goal: 0 ([id number])  Complications: No apparent anesthesia complications

## 2019-09-03 NOTE — Progress Notes (Signed)
Dr Marcello Moores called   Blood pressure 130/95

## 2019-09-03 NOTE — Anesthesia Procedure Notes (Signed)
Procedure Name: LMA Insertion Performed by: Rona Ravens, CRNA Pre-anesthesia Checklist: Patient identified, Emergency Drugs available and Suction available Patient Re-evaluated:Patient Re-evaluated prior to induction Oxygen Delivery Method: Circle system utilized Preoxygenation: Pre-oxygenation with 100% oxygen Induction Type: IV induction LMA: LMA inserted LMA Size: 4.5 Number of attempts: 1 Placement Confirmation: positive ETCO2,  breath sounds checked- equal and bilateral and CO2 detector Tube secured with: Tape Dental Injury: Teeth and Oropharynx as per pre-operative assessment

## 2019-09-05 NOTE — Anesthesia Postprocedure Evaluation (Signed)
Anesthesia Post Note  Patient: Dylan Hendrix  Procedure(s) Performed: HYDROCELECTOMY ADULT (Right Scrotum) VASECTOMY (Bilateral Perineum)  Patient location during evaluation: PACU Anesthesia Type: General Level of consciousness: awake and alert Pain management: pain level controlled Vital Signs Assessment: post-procedure vital signs reviewed and stable Respiratory status: spontaneous breathing, nonlabored ventilation and respiratory function stable Cardiovascular status: blood pressure returned to baseline and stable Postop Assessment: no apparent nausea or vomiting Anesthetic complications: no     Last Vitals:  Vitals:   09/03/19 1616 09/03/19 1627  BP: (!) 130/95 (!) 145/90  Pulse: 77 71  Resp: 18 16  Temp:  36.5 C  SpO2: 96% 98%    Last Pain:  Vitals:   09/03/19 1627  TempSrc: Temporal  PainSc: 0-No pain                 Tera Mater

## 2019-09-07 ENCOUNTER — Telehealth: Payer: Self-pay | Admitting: *Deleted

## 2019-09-07 DIAGNOSIS — Z3141 Encounter for fertility testing: Secondary | ICD-10-CM

## 2019-09-07 DIAGNOSIS — Z0189 Encounter for other specified special examinations: Secondary | ICD-10-CM

## 2019-09-07 NOTE — Telephone Encounter (Addendum)
Appointment scheduled-orders placed. Need to get supply for lab visit at follow up.  ----- Message from [name], MD sent at 09/03/2019  4:23 PM EST ----- Regarding: semen analysis Please set up semen analysis in 3 months, he had a vasectomy in the OR today with me.  Nickolas Madrid, MD 09/03/2019

## 2019-10-12 ENCOUNTER — Encounter: Payer: Self-pay | Admitting: Urology

## 2019-10-12 ENCOUNTER — Other Ambulatory Visit: Payer: Self-pay

## 2019-10-12 ENCOUNTER — Ambulatory Visit (INDEPENDENT_AMBULATORY_CARE_PROVIDER_SITE_OTHER): Payer: BC Managed Care – PPO | Admitting: Urology

## 2019-10-12 VITALS — BP 149/99 | HR 84 | Ht 66.0 in | Wt 216.0 lb

## 2019-10-12 DIAGNOSIS — N433 Hydrocele, unspecified: Secondary | ICD-10-CM | POA: Diagnosis not present

## 2019-10-12 NOTE — Progress Notes (Signed)
   10/12/2019 10:27 AM   Dylan Hendrix [birth year] [id number]  Reason for visit: Follow up right hydrocelectomy, vasectomy  HPI: I saw [name] in urology clinic for follow-up after undergoing a right hydrocelectomy and vasectomy on 09/03/2019.  Intra-Op, there was a significant amount of fibrosis and scarring.  Overall, he is doing well since surgery and denies any significant complaints today.  He does occasionally have some soreness, and his swelling on the right side has continued to decrease.  He denies any gross hematuria or dysuria.  On exam, there is some mild right-sided scrotal swelling consistent with typical postop hydrocele changes and a well-healed scrotal incision.  We again reviewed the need to continue alternative contraception for 2 months and then drop off semen sample at that time to confirm negative.  I also reviewed some fitting underwear, NSAIDs and icing as needed regarding his right-sided scrotal swelling.  Secondary to the significant amount of fibrosis and scarring intraoperatively, I would anticipate this will take another few weeks to even months to completely heal.  Semen sample 2 months, then follow-up as needed  A total of 20 minutes were spent face-to-face with the patient, greater than 50% was spent in patient education, counseling, and coordination of care regarding postop follow-up.  [name], [facility] [address], [place], [place] [address] [phone]

## 2019-11-22 ENCOUNTER — Encounter: Payer: Self-pay | Admitting: Occupational Therapy

## 2019-11-22 ENCOUNTER — Ambulatory Visit: Payer: BC Managed Care – PPO | Attending: Orthopedic Surgery | Admitting: Occupational Therapy

## 2019-11-22 ENCOUNTER — Other Ambulatory Visit: Payer: Self-pay

## 2019-11-22 DIAGNOSIS — M6281 Muscle weakness (generalized): Secondary | ICD-10-CM | POA: Diagnosis present

## 2019-11-22 DIAGNOSIS — R208 Other disturbances of skin sensation: Secondary | ICD-10-CM

## 2019-11-22 DIAGNOSIS — M79642 Pain in left hand: Secondary | ICD-10-CM | POA: Insufficient documentation

## 2019-11-22 DIAGNOSIS — R209 Unspecified disturbances of skin sensation: Secondary | ICD-10-CM | POA: Diagnosis present

## 2019-11-22 DIAGNOSIS — M25642 Stiffness of left hand, not elsewhere classified: Secondary | ICD-10-CM | POA: Diagnosis present

## 2019-11-22 NOTE — Patient Instructions (Signed)
Contrast  Wrist extention AAROM bilateral  Tendon glides - gentle AROM and not tight fist - avoid tight and sustained grip  10 reps Med N glide  5 reps Joint protection - or modifications because being only 71/2 wks out from surgery

## 2019-11-22 NOTE — Therapy (Signed)
[facility] [address], [place], [address] Phone: [phone]   Fax:  [phone]  Occupational Therapy Evaluation  Patient Details  Name: Dylan Hendrix MRN: [id number] Date of Birth: 07/19/1961 Referring Provider (OT): Rudene Christians   Encounter Date: 11/22/2019  OT End of Session - 11/22/19 1940    Visit Number  1    Number of Visits  10    Date for OT Re-Evaluation  12/27/19    OT Start Time  1500    OT Stop Time  1606    OT Time Calculation (min)  66 min    Activity Tolerance  Patient tolerated treatment well    Behavior During Therapy  [facility] for tasks assessed/performed       Past Medical History:  Diagnosis Date  . Arthritis   . Asthma    AS A CHILD  . Bilateral carpal tunnel syndrome 08/2019   patient to have right hand release in office on 09/01/19  . Cancer ([place])   . Effusion of knee joint, left 08/2019  . GERD (gastroesophageal reflux disease)   . History of Barrett's esophagus   . History of tongue cancer   . Spondylosis of lumbar region without myelopathy or radiculopathy     Past Surgical History:  Procedure Laterality Date  . HERNIA REPAIR    . HYDROCELE EXCISION Right 09/03/2019   Procedure: HYDROCELECTOMY ADULT;  Surgeon: [name], MD;  Location: [facility] ORS;  Service: Urology;  Laterality: Right;  . TONGUE SURGERY  2002   cancer of tongue removed.  reoccurence and repeat removal in 2008.  Marland Kitchen VASECTOMY Bilateral 09/03/2019   Procedure: VASECTOMY;  Surgeon: [name], MD;  Location: [facility] ORS;  Service: Urology;  Laterality: Bilateral;    There were no vitals filed for this visit.  Subjective Assessment - 11/22/19 1927    Subjective   I had my R hand done Aug 31, 2019 - and then my L hand 11/10/2019- but went back to work 3 wks after my L hand - work in Engineering geologist at [employer] - so I am in the freezer a lot - my R hand doing better- it was 6 wks after my R hand but 2-3 wks on  my L hand - still numbness and pain in my L hand    Pertinent History  Pt had R CTR 08/30/2020 - was out of work with 2 other procedures and then 10/01/2019 had CTR on R hand -  back to work in the last 2-3 wks - pain and numbness worse i L hand    Patient Stated Goals  Want to pain and numbness in both hands better so I can use them at work - grip , not drop things , cut with clippers    Currently in Pain?  Yes    Pain Score  8     Pain Location  Hand    Pain Orientation  Left    Pain Descriptors / Indicators  Numbness;Pins and needles;Stabbing    Pain Type  Surgical pain    Pain Onset  More than a month ago    Pain Frequency  Intermittent        [facility] OT Assessment - 11/22/19 0001      Assessment   Medical Diagnosis  L CTR    Referring Provider (OT)  Rudene Christians    Onset Date/Surgical Date  10/01/19    Hand Dominance  Right  Home  Environment   Lives With  Alone      Prior Function   Vocation  Full time employment    Leisure  Work at [employer],       Pharmacist, community Grip (lbs)  59   pain   Right Hand Lateral Pinch  20 lbs    Right Hand 3 Point Pinch  14 lbs    Left Hand Grip (lbs)  54   numbness   Left Hand Lateral Pinch  14 lbs    Left Hand 3 Point Pinch  8 lbs      Right Hand AROM   R Index  MCP 0-90  85 Degrees    R Index PIP 0-100  90 Degrees    R Long  MCP 0-90  90 Degrees    R Long PIP 0-100  90 Degrees    R Ring  MCP 0-90  90 Degrees    R Ring PIP 0-100  90 Degrees    R Little  MCP 0-90  90 Degrees    R Little PIP 0-100  90 Degrees      Left Hand AROM   L Index  MCP 0-90  85 Degrees    L Index PIP 0-100  100 Degrees    L Long  MCP 0-90  90 Degrees    L Long PIP 0-100  100 Degrees    L Ring  MCP 0-90  90 Degrees    L Ring PIP 0-100  100 Degrees    L Little  MCP 0-90  90 Degrees    L Little PIP 0-100  100 Degrees         contrast done -review HEP - see hand out review    Contrast  Wrist extention AAROM bilateral  Tendon glides - gentle AROM  and not tight fist - avoid tight and sustained grip  10 reps Med N glide  5 reps Joint protection - or modifications because being only 71/2 wks out from surgery              OT Education - 11/22/19 1939    Education Details  Findings of eval and HEP    Person(s) Educated  Patient    Methods  Explanation;Demonstration;Tactile cues;Verbal cues;Handout    Comprehension  Verbal cues required;Returned demonstration;Verbalized understanding       OT Short Term Goals - 11/22/19 1952      OT SHORT TERM GOAL #1   Title  Pt to be independent in HEP to decrease pain , numbness and increase functional use of L hand in ADL's and IADL's    Baseline  pain 4-8/10 in L hand , numbness in 3rd 4.56 semmes Weinstein - and 3.61 ib thumb and 2nd - decreae functional use    Time  3    Status  New    Target Date  12/13/19        OT Long Term Goals - 11/22/19 1955      OT LONG TERM GOAL #1   Title  Pt show increase AROM to WNL in L hand with pain less than 3/10 at work    Baseline  pain increase to 8/10 and PIP's 90's at eval    Time  5    Period  Weeks    Status  New    Target Date  12/27/19      OT LONG TERM GOAL #2   Title  L Prehension strength increase with  more than 2-3 lbs to be able to cut nails    Baseline  Lat and point grip -R 20L 14, R 14 and L 8 lbs - numbness in 3rd    Time  5    Period  Weeks    Status  New    Target Date  12/27/19            Plan - 11/22/19 1944    Clinical Impression Statement  Pt is 7 1/2 wks s/p L CTR - and R CTR about 12 wks - pt with numbness in L hand - 4.56 in 3rd digit- but 3.61 in palm and thumb thru 2nd - increase pain , some scar adhesios at proximal scar with decrease wrist extenton and digits flexion end range at PIP's - with decrease lat and 3 point grip - iimiting his functional use of L hand in ADL;s    OT Occupational Profile and History  Problem Focused Assessment - Including review of records relating to presenting problem     Occupational performance deficits (Please refer to evaluation for details):  ADL's;IADL's;Work;Play;Leisure    Body Structure / Function / Physical Skills  ADL;Flexibility;ROM;UE functional use;Pain;Edema;Sensation;Scar mobility    Rehab Potential  Good    Clinical Decision Making  Limited treatment options, no task modification necessary    Comorbidities Affecting Occupational Performance:  None    Modification or Assistance to Complete Evaluation   No modification of tasks or assist necessary to complete eval    OT Frequency  2x / week    OT Duration  --   5 wks   OT Treatment/Interventions  Self-care/ADL training;Therapeutic exercise;Fluidtherapy;Contrast Bath;Ultrasound;Manual Therapy;Scar mobilization;Patient/family education    Plan  Assess progress with HEP -and adjust HEP    OT Home Exercise Plan  see pt instruction    Consulted and Agree with Plan of Care  Patient       Patient will benefit from skilled therapeutic intervention in order to improve the following deficits and impairments:   Body Structure / Function / Physical Skills: ADL, Flexibility, ROM, UE functional use, Pain, Edema, Sensation, Scar mobility       Visit Diagnosis: Other disturbances of skin sensation - Plan: Ot plan of care cert/re-cert  Pain in left hand - Plan: Ot plan of care cert/re-cert  Muscle weakness (generalized) - Plan: Ot plan of care cert/re-cert  Stiffness of left hand, not elsewhere classified - Plan: Ot plan of care cert/re-cert    Problem List Patient Active Problem List   Diagnosis Date Noted  . Hyperlipidemia 08/03/2019  . 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 History of Barrett's esophagus 05/30/2015  . Hx of adenomatous colonic polyps 05/30/2015  . HNP (herniated nucleus pulposus), lumbar 02/01/2015  . Lumbar radiculitis 07/01/2014  . GERD (gastroesophageal reflux disease) 04/07/2014  . Low back pain 04/07/2014  . Seasonal allergies 04/07/2014    Rosalyn Gess  OTR/L,CLT 11/22/2019, 8:10 PM  [facility] [address] [place], [address] Phone: [phone]   Fax:  [phone]  Name: HARSHAAN FRAKES MRN: [id number] Date of Birth: 1961-05-10

## 2019-11-29 ENCOUNTER — Ambulatory Visit: Payer: BC Managed Care – PPO | Admitting: Occupational Therapy

## 2019-12-01 ENCOUNTER — Ambulatory Visit: Payer: BC Managed Care – PPO | Admitting: Occupational Therapy

## 2019-12-06 ENCOUNTER — Other Ambulatory Visit: Payer: Self-pay

## 2019-12-07 ENCOUNTER — Other Ambulatory Visit: Payer: Self-pay

## 2019-12-07 ENCOUNTER — Other Ambulatory Visit: Payer: BC Managed Care – PPO

## 2019-12-07 DIAGNOSIS — Z0189 Encounter for other specified special examinations: Secondary | ICD-10-CM

## 2019-12-07 DIAGNOSIS — Z3141 Encounter for fertility testing: Secondary | ICD-10-CM

## 2019-12-08 ENCOUNTER — Encounter: Payer: BC Managed Care – PPO | Admitting: Occupational Therapy

## 2019-12-08 LAB — POST-VAS SPERM EVALUATION,QUAL: Volume: 2.8 mL

## 2019-12-09 ENCOUNTER — Telehealth: Payer: Self-pay

## 2019-12-09 NOTE — Telephone Encounter (Signed)
-----   Message from [name], MD sent at 12/09/2019  2:09 PM EDT ----- No sperm seen, ok to stop alternative contraception  Nickolas Madrid, MD 12/09/2019

## 2019-12-09 NOTE — Telephone Encounter (Signed)
Called pt informed him of the information below. Pt gave verbal understanding.  

## 2019-12-13 ENCOUNTER — Other Ambulatory Visit: Payer: Self-pay

## 2019-12-13 ENCOUNTER — Ambulatory Visit: Payer: BC Managed Care – PPO | Admitting: Occupational Therapy

## 2019-12-13 DIAGNOSIS — R209 Unspecified disturbances of skin sensation: Secondary | ICD-10-CM | POA: Diagnosis not present

## 2019-12-13 DIAGNOSIS — R208 Other disturbances of skin sensation: Secondary | ICD-10-CM

## 2019-12-13 DIAGNOSIS — M79642 Pain in left hand: Secondary | ICD-10-CM

## 2019-12-13 DIAGNOSIS — M25642 Stiffness of left hand, not elsewhere classified: Secondary | ICD-10-CM

## 2019-12-13 DIAGNOSIS — M6281 Muscle weakness (generalized): Secondary | ICD-10-CM

## 2019-12-15 ENCOUNTER — Encounter: Payer: Self-pay | Admitting: Occupational Therapy

## 2019-12-15 NOTE — Therapy (Signed)
[facility] [address], [place], [address] Phone: [phone]   Fax:  [phone]  Occupational Therapy Treatment  Patient Details  Name: Dylan Hendrix MRN: [id number] Date of Birth: 01/08/1961 Referring Provider (OT): Rudene Christians   Encounter Date: 12/13/2019  OT End of Session - 12/15/19 1958    Visit Number  2    Number of Visits  10    Date for OT Re-Evaluation  12/27/19    OT Start Time  [id number]    OT Stop Time  1702    OT Time Calculation (min)  47 min    Activity Tolerance  Patient tolerated treatment well    Behavior During Therapy  [facility] for tasks assessed/performed       Past Medical History:  Diagnosis Date  . Arthritis   . Asthma    AS A CHILD  . Bilateral carpal tunnel syndrome 08/2019   patient to have right hand release in office on 09/01/19  . Cancer ([place])   . Effusion of knee joint, left 08/2019  . GERD (gastroesophageal reflux disease)   . History of Barrett's esophagus   . History of tongue cancer   . Spondylosis of lumbar region without myelopathy or radiculopathy     Past Surgical History:  Procedure Laterality Date  . HERNIA REPAIR    . HYDROCELE EXCISION Right 09/03/2019   Procedure: HYDROCELECTOMY ADULT;  Surgeon: [name], MD;  Location: [facility] ORS;  Service: Urology;  Laterality: Right;  . TONGUE SURGERY  2002   cancer of tongue removed.  reoccurence and repeat removal in 2008.  Marland Kitchen VASECTOMY Bilateral 09/03/2019   Procedure: VASECTOMY;  Surgeon: [name], MD;  Location: [facility] ORS;  Service: Urology;  Laterality: Bilateral;    There were no vitals filed for this visit.  Subjective Assessment - 12/15/19 1954    Subjective   Patient reports he missed appts last week due to work. Patient works at [employer] in [place] [place] and has to often lift heavy objects.    Pertinent History  Pt had R CTR 08/30/2020 - was out of work with 2 other procedures and then  10/01/2019 had CTR on R hand -  back to work in the last 2-3 wks - pain and numbness worse i L hand    Patient Stated Goals  Want to pain and numbness in both hands better so I can use them at work - grip , not drop things , cut with clippers    Currently in Pain?  Yes    Pain Score  6     Pain Location  Hand    Pain Orientation  Left    Pain Descriptors / Indicators  Pins and needles    Pain Type  Surgical pain    Pain Onset  More than a month ago    Pain Frequency  Intermittent    Multiple Pain Sites  No        Patient seen for Contrast to bilateral UEs per flowsheet to decrease edema and increase ROM Measurements taken per flowsheet, decreased grip strength this session compared to last.  Followed by manual therapy for soft tissue scar massage Wrist extension AAROM bilateral  Tendon glides - gentle AROM and not tight fist - avoid tight and sustained grip  10 reps Med N glide 5 reps  Semmes Weinstein 2.83 on left forearm 3.61 on hand except scar area, palm and ulnar side of  hand 4.31 palm  Joint protection and activity modifications for work related tasks.  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  Discussed some options such as avoiding lifting cases but to unload 1-2 pieces at a time, use of cart, alternating tasks with other workers, shorter periods of activity.  Patient reports he will try to work towards options to help protect his hands and joints.  Use of box cutter rather than trying to rip heavy plastic open.    [facility] OT Assessment - 12/15/19 2033      Assessment   Medical Diagnosis  L CTR    Referring Provider (OT)  Rudene Christians    Onset Date/Surgical Date  10/01/19    Hand Dominance  Right      Strength   Right Hand Grip (lbs)  55    Right Hand Lateral Pinch  18 lbs    Right Hand 3 Point Pinch  12 lbs    Left Hand Grip (lbs)  45    Left Hand Lateral Pinch  11 lbs    Left Hand 3 Point Pinch  7 lbs       Left Hand AROM   L Index  MCP 0-90  85 Degrees    L Index PIP 0-100  100 Degrees    L Long  MCP 0-90  90 Degrees    L Long PIP 0-100  100 Degrees    L Ring  MCP 0-90  90 Degrees    L Ring PIP 0-100  100 Degrees    L Little  MCP 0-90  90 Degrees    L Little PIP 0-100  100 Degrees                       OT Education - 12/15/19 1958    Education Details  HEP, work modifications    Person(s) Educated  Patient    Methods  Explanation;Demonstration;Tactile cues;Verbal cues;Handout    Comprehension  Verbal cues required;Returned demonstration;Verbalized understanding       OT Short Term Goals - 11/22/19 1952      OT SHORT TERM GOAL #1   Title  Pt to be independent in HEP to decrease pain , numbness and increase functional use of L hand in ADL's and IADL's    Baseline  pain 4-8/10 in L hand , numbness in 3rd 4.56 semmes Weinstein - and 3.61 ib thumb and 2nd - decreae functional use    Time  3    Status  New    Target Date  12/13/19        OT Long Term Goals - 11/22/19 1955      OT LONG TERM GOAL #1   Title  Pt show increase AROM to WNL in L hand with pain less than 3/10 at work    Baseline  pain increase to 8/10 and PIP's 90's at eval    Time  5    Period  Weeks    Status  New    Target Date  12/27/19      OT LONG TERM GOAL #2   Title  L Prehension strength increase with more than 2-3 lbs to be able to cut nails    Baseline  Lat and point grip -R 20L 14, R 14 and L 8 lbs - numbness in 3rd    Time  5    Period  Weeks  Status  New    Target Date  12/27/19            Plan - 12/15/19 1959    Clinical Impression Statement  Patient has not been seen in last couple weeks, reports he has been working.  Measurements reveal decreased grip and pinch compared to first session.  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  Discussed work modifications as well as HEP, care of himself and hand.  Continue to  work towards goals in plan of care to increase ROM, decrease pain, increase functional use for daily tasks at work and home.    OT Occupational Profile and History  Problem Focused Assessment - Including review of records relating to presenting problem    Occupational performance deficits (Please refer to evaluation for details):  ADL's;IADL's;Work;Play;Leisure    Body Structure / Function / Physical Skills  ADL;Flexibility;ROM;UE functional use;Pain;Edema;Sensation;Scar mobility    Rehab Potential  Good    Clinical Decision Making  Limited treatment options, no task modification necessary    Comorbidities Affecting Occupational Performance:  None    Modification or Assistance to Complete Evaluation   No modification of tasks or assist necessary to complete eval    OT Frequency  2x / week    OT Duration  --   5 weeks   OT Treatment/Interventions  Self-care/ADL training;Therapeutic exercise;Fluidtherapy;Contrast Bath;Ultrasound;Manual Therapy;Scar mobilization;Patient/family education    Consulted and Agree with Plan of Care  Patient       Patient will benefit from skilled therapeutic intervention in order to improve the following deficits and impairments:   Body Structure / Function / Physical Skills: ADL, Flexibility, ROM, UE functional use, Pain, Edema, Sensation, Scar mobility       Visit Diagnosis: Pain in left hand  Muscle weakness (generalized)  Stiffness of left hand, not elsewhere classified  Other disturbances of skin sensation    Problem List Patient Active Problem List   Diagnosis Date Noted  . Hyperlipidemia 08/03/2019  . 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 History of Barrett's esophagus 05/30/2015  . Hx of adenomatous colonic polyps 05/30/2015  . HNP (herniated nucleus pulposus), lumbar 02/01/2015  . Lumbar radiculitis 07/01/2014  . GERD (gastroesophageal reflux disease) 04/07/2014  . Low back pain 04/07/2014  . Seasonal allergies 04/07/2014   Kamilya Wakeman T Tomasita Morrow,  OTR/L, CLT  Dick Hark 12/15/2019, 8:43 PM  [facility] [address], [place], [address] Phone: [phone]   Fax:  [phone]  Name: Dylan Hendrix MRN: [id number] Date of Birth: July 13, 1961

## 2021-02-08 IMAGING — MR MR KNEE*L* W/O CM
6 series · 40 of 40 positions shown · non-contrast
Comparison: None.

CLINICAL DATA: Medial left knee pain, swelling and popping for few
months. No known injury.

EXAM:
MRI OF THE LEFT KNEE WITHOUT CONTRAST
TECHNIQUE: Multiplanar, multisequence MR imaging of the knee was performed. No
intravenous contrast was administered.

[Series 9: T1 · coronal · left · 4.0mm · 0.42mm/px · 6 of 28 slices shown]
[im 1/28]
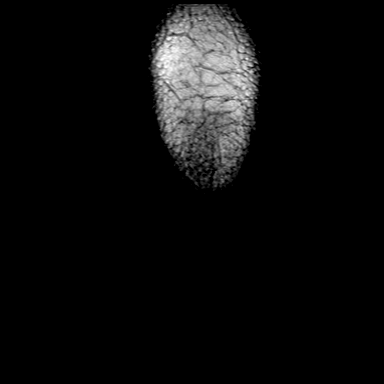
[im 6/28]
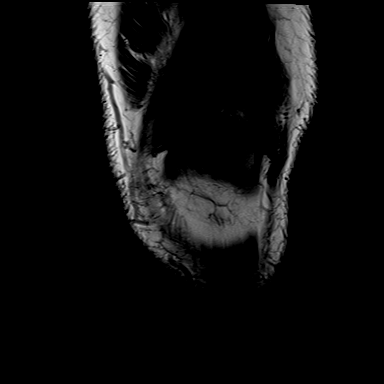
[im 11/28]
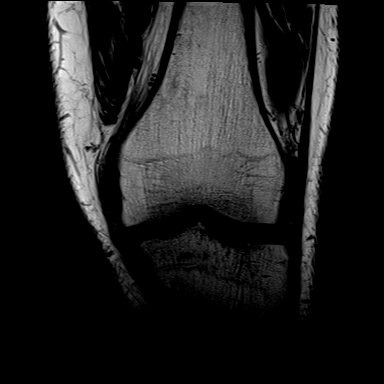
[im 17/28]
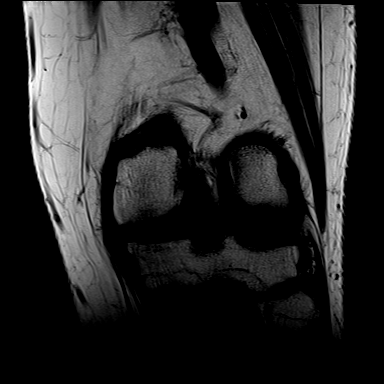
[im 22/28]
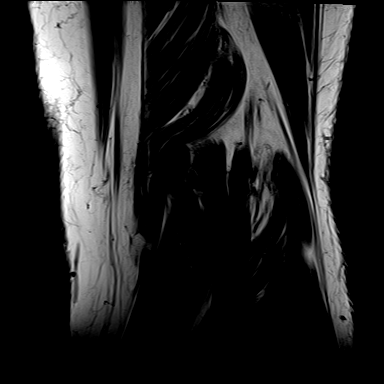
[im 28/28]
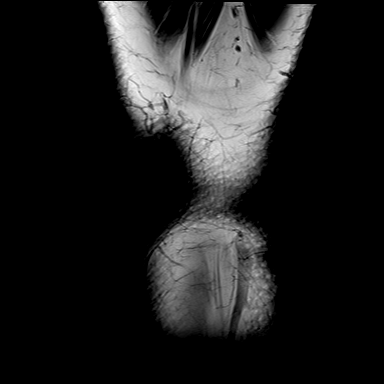

[Series 10: T2 fat-sat · coronal · left · 4.0mm · 0.59mm/px · 7 of 28 slices shown (1 of 3)]
[im 1/28]
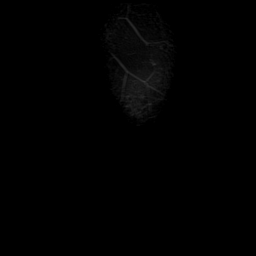
[im 5/28]
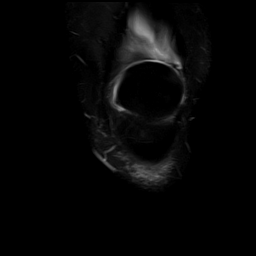
[im 10/28]
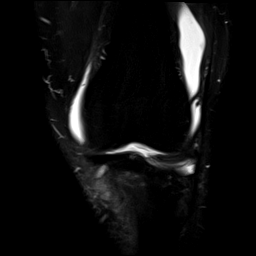
[im 14/28]
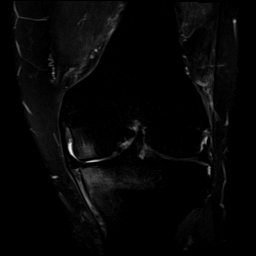
[im 19/28]
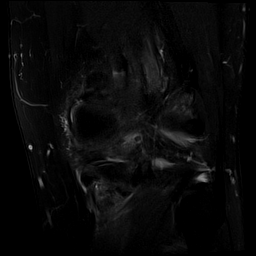
[im 23/28]
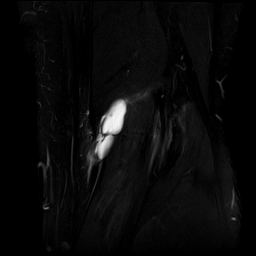
[im 28/28]
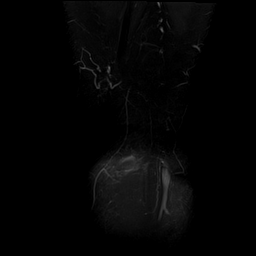

[Series 11: PD fat-sat · coronal · left · 4.0mm · 0.59mm/px · 7 of 28 slices shown (1 of 2)]
[im 1/28]
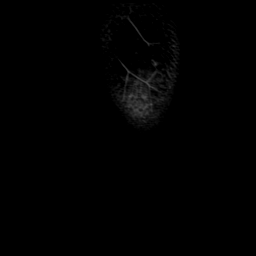
[im 5/28]
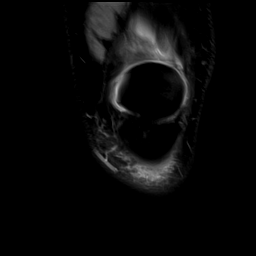
[im 10/28]
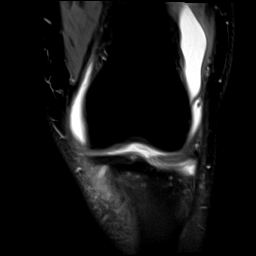
[im 14/28]
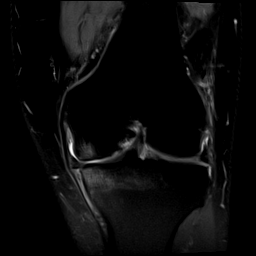
[im 19/28]
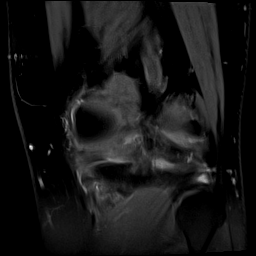
[im 23/28]
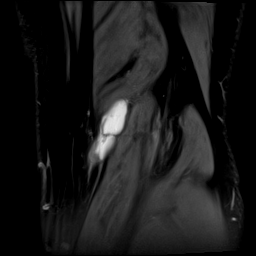
[im 28/28]
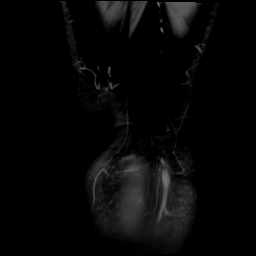

[Series 12: PD fat-sat · sagittal · left · 3.0mm · 0.59mm/px · 7 of 29 slices shown (2 of 2)]
[im 1/29]
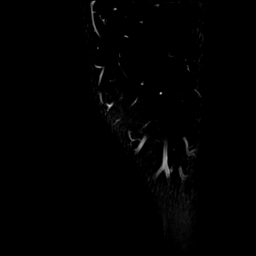
[im 5/29]
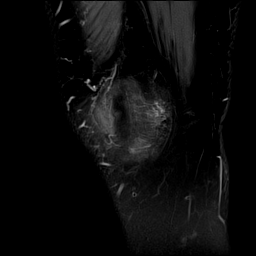
[im 10/29]
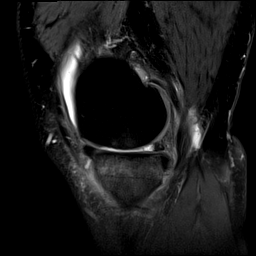
[im 15/29]
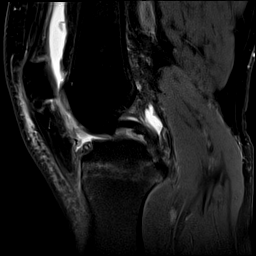
[im 19/29]
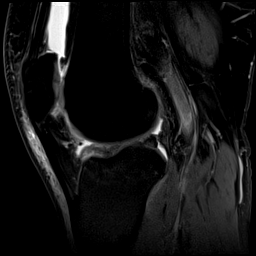
[im 24/29]
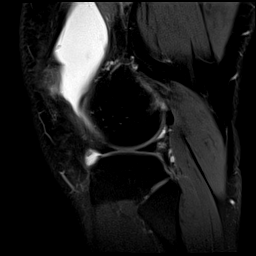
[im 29/29]
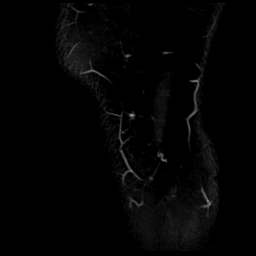

[Series 13: T2 fat-sat · sagittal · left · 3.0mm · 0.59mm/px · 7 of 30 slices shown (2 of 3)]
[im 1/30]
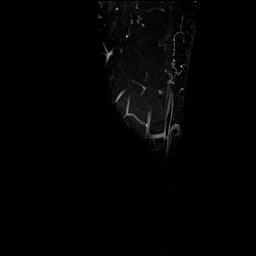
[im 5/30]
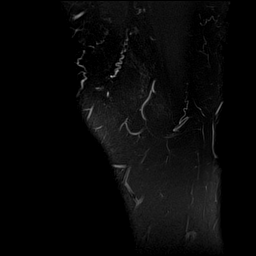
[im 10/30]
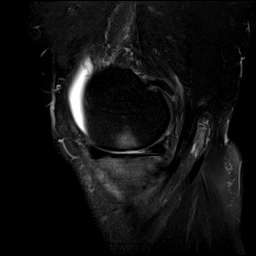
[im 15/30]
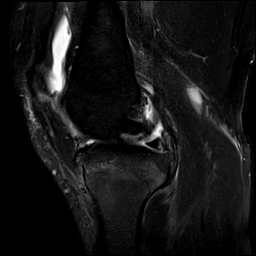
[im 20/30]
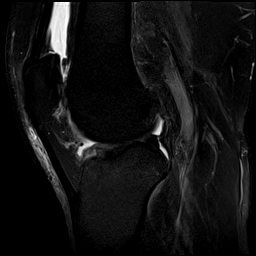
[im 25/30]
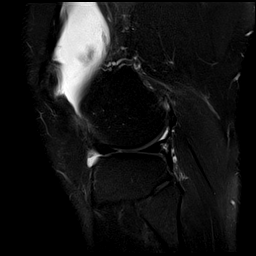
[im 30/30]
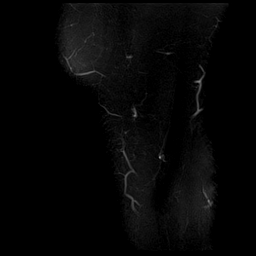

[Series 14: T2 fat-sat · axial · left · 4.0mm · 0.50mm/px · z∈[-95,+30]mm · 6 of 26 slices shown (3 of 3)]
[im 1/26]
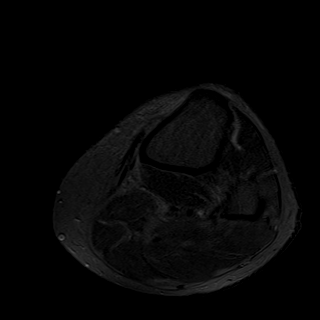
[im 6/26]
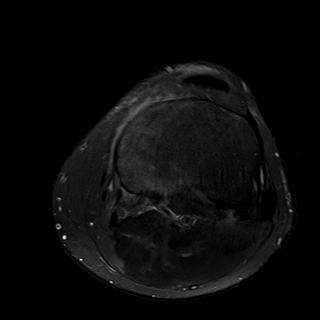
[im 11/26]
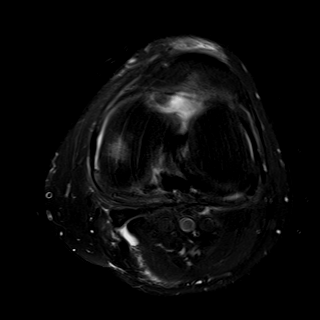
[im 16/26]
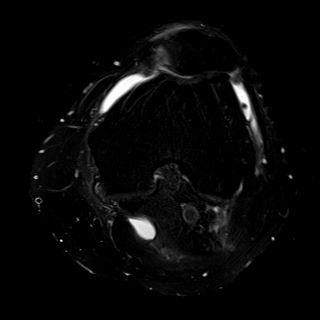
[im 21/26]
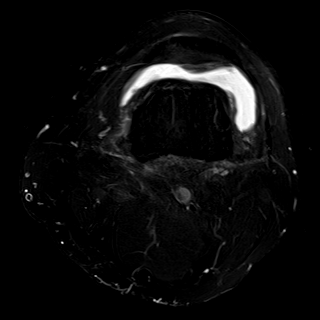
[im 26/26]
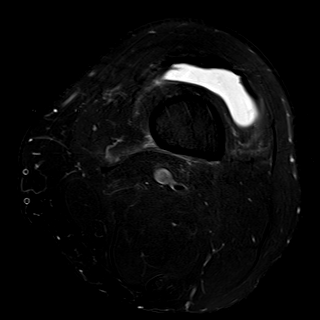

[40 of 40 positions shown; findings below may reference images not displayed]

FINDINGS: MENISCI

Medial meniscus: Complex tearing is seen in the posterior horn and
includes a full-thickness radial tear through the central aspect of
the posterior horn. Horizontal tear in the posterior body reaches
the meniscal undersurface. The body is diminutive and extruded
peripherally.

Lateral meniscus: Focal blunting along the free edge of the central
body is consistent with a free edge radial tear.

LIGAMENTS

Cruciates:  Intact.

Collaterals:  Intact.

CARTILAGE

Patellofemoral:  Mildly degenerated.

Medial: Marked cartilage loss is present with associated joint space
narrowing.

Lateral:  Moderately degenerated.

Joint:  Moderate joint effusion.

Popliteal Fossa: Baker's cyst measures 1.3 cm transverse by 0.8 cm
AP by 4 cm craniocaudal.

Extensor Mechanism:  Intact.

Bones: Subchondral edema is present about the medial compartment. No
fracture or focal lesion.

Other: None.
IMPRESSION: Extensive complex tearing posterior horn of the medial meniscus
includes a full-thickness radial tear through the central posterior
horn. Horizontal tear in the posterior body of the medial meniscus
is also seen.

Focal radial tear free edge of the central body of the lateral
meniscus.

Osteoarthritis about the knee is advanced in the medial compartment.

## 2022-04-09 DIAGNOSIS — M9903 Segmental and somatic dysfunction of lumbar region: Secondary | ICD-10-CM | POA: Diagnosis not present

## 2022-04-09 DIAGNOSIS — M5416 Radiculopathy, lumbar region: Secondary | ICD-10-CM | POA: Diagnosis not present

## 2022-04-09 DIAGNOSIS — M5431 Sciatica, right side: Secondary | ICD-10-CM | POA: Diagnosis not present

## 2022-04-09 DIAGNOSIS — M9905 Segmental and somatic dysfunction of pelvic region: Secondary | ICD-10-CM | POA: Diagnosis not present

## 2022-05-21 DIAGNOSIS — M5416 Radiculopathy, lumbar region: Secondary | ICD-10-CM | POA: Diagnosis not present

## 2022-05-21 DIAGNOSIS — M9903 Segmental and somatic dysfunction of lumbar region: Secondary | ICD-10-CM | POA: Diagnosis not present

## 2022-05-21 DIAGNOSIS — M9905 Segmental and somatic dysfunction of pelvic region: Secondary | ICD-10-CM | POA: Diagnosis not present

## 2022-05-21 DIAGNOSIS — M5431 Sciatica, right side: Secondary | ICD-10-CM | POA: Diagnosis not present

## 2022-07-02 DIAGNOSIS — M9905 Segmental and somatic dysfunction of pelvic region: Secondary | ICD-10-CM | POA: Diagnosis not present

## 2022-07-02 DIAGNOSIS — M5431 Sciatica, right side: Secondary | ICD-10-CM | POA: Diagnosis not present

## 2022-07-02 DIAGNOSIS — M9903 Segmental and somatic dysfunction of lumbar region: Secondary | ICD-10-CM | POA: Diagnosis not present

## 2022-07-02 DIAGNOSIS — M5416 Radiculopathy, lumbar region: Secondary | ICD-10-CM | POA: Diagnosis not present

## 2022-09-24 DIAGNOSIS — M9905 Segmental and somatic dysfunction of pelvic region: Secondary | ICD-10-CM | POA: Diagnosis not present

## 2022-09-24 DIAGNOSIS — M5416 Radiculopathy, lumbar region: Secondary | ICD-10-CM | POA: Diagnosis not present

## 2022-09-24 DIAGNOSIS — M5431 Sciatica, right side: Secondary | ICD-10-CM | POA: Diagnosis not present

## 2022-09-24 DIAGNOSIS — M9903 Segmental and somatic dysfunction of lumbar region: Secondary | ICD-10-CM | POA: Diagnosis not present

## 2022-10-15 DIAGNOSIS — M5431 Sciatica, right side: Secondary | ICD-10-CM | POA: Diagnosis not present

## 2022-10-15 DIAGNOSIS — M5416 Radiculopathy, lumbar region: Secondary | ICD-10-CM | POA: Diagnosis not present

## 2022-10-15 DIAGNOSIS — M9903 Segmental and somatic dysfunction of lumbar region: Secondary | ICD-10-CM | POA: Diagnosis not present

## 2022-10-15 DIAGNOSIS — M9905 Segmental and somatic dysfunction of pelvic region: Secondary | ICD-10-CM | POA: Diagnosis not present

## 2022-11-08 DIAGNOSIS — M5416 Radiculopathy, lumbar region: Secondary | ICD-10-CM | POA: Diagnosis not present

## 2022-11-08 DIAGNOSIS — M9905 Segmental and somatic dysfunction of pelvic region: Secondary | ICD-10-CM | POA: Diagnosis not present

## 2022-11-08 DIAGNOSIS — M9903 Segmental and somatic dysfunction of lumbar region: Secondary | ICD-10-CM | POA: Diagnosis not present

## 2022-11-08 DIAGNOSIS — M5431 Sciatica, right side: Secondary | ICD-10-CM | POA: Diagnosis not present

## 2022-11-14 DIAGNOSIS — K219 Gastro-esophageal reflux disease without esophagitis: Secondary | ICD-10-CM | POA: Diagnosis not present

## 2022-11-14 DIAGNOSIS — Z0000 Encounter for general adult medical examination without abnormal findings: Secondary | ICD-10-CM | POA: Diagnosis not present

## 2022-11-14 DIAGNOSIS — J302 Other seasonal allergic rhinitis: Secondary | ICD-10-CM | POA: Diagnosis not present

## 2022-11-14 DIAGNOSIS — M1712 Unilateral primary osteoarthritis, left knee: Secondary | ICD-10-CM | POA: Diagnosis not present

## 2022-11-14 DIAGNOSIS — E785 Hyperlipidemia, unspecified: Secondary | ICD-10-CM | POA: Diagnosis not present

## 2022-11-14 DIAGNOSIS — M5441 Lumbago with sciatica, right side: Secondary | ICD-10-CM | POA: Diagnosis not present

## 2022-11-14 DIAGNOSIS — Z125 Encounter for screening for malignant neoplasm of prostate: Secondary | ICD-10-CM | POA: Diagnosis not present

## 2022-11-14 DIAGNOSIS — G8929 Other chronic pain: Secondary | ICD-10-CM | POA: Diagnosis not present

## 2022-11-14 DIAGNOSIS — M7661 Achilles tendinitis, right leg: Secondary | ICD-10-CM | POA: Diagnosis not present

## 2022-11-14 DIAGNOSIS — R0683 Snoring: Secondary | ICD-10-CM | POA: Diagnosis not present

## 2022-12-03 DIAGNOSIS — M5431 Sciatica, right side: Secondary | ICD-10-CM | POA: Diagnosis not present

## 2022-12-03 DIAGNOSIS — M5416 Radiculopathy, lumbar region: Secondary | ICD-10-CM | POA: Diagnosis not present

## 2022-12-03 DIAGNOSIS — M9905 Segmental and somatic dysfunction of pelvic region: Secondary | ICD-10-CM | POA: Diagnosis not present

## 2022-12-03 DIAGNOSIS — M9903 Segmental and somatic dysfunction of lumbar region: Secondary | ICD-10-CM | POA: Diagnosis not present

## 2023-01-14 DIAGNOSIS — M9905 Segmental and somatic dysfunction of pelvic region: Secondary | ICD-10-CM | POA: Diagnosis not present

## 2023-01-14 DIAGNOSIS — M5416 Radiculopathy, lumbar region: Secondary | ICD-10-CM | POA: Diagnosis not present

## 2023-01-14 DIAGNOSIS — M9903 Segmental and somatic dysfunction of lumbar region: Secondary | ICD-10-CM | POA: Diagnosis not present

## 2023-01-14 DIAGNOSIS — M5431 Sciatica, right side: Secondary | ICD-10-CM | POA: Diagnosis not present

## 2023-01-16 DIAGNOSIS — G4733 Obstructive sleep apnea (adult) (pediatric): Secondary | ICD-10-CM | POA: Diagnosis not present

## 2023-02-04 DIAGNOSIS — E785 Hyperlipidemia, unspecified: Secondary | ICD-10-CM | POA: Diagnosis not present

## 2023-02-04 DIAGNOSIS — R002 Palpitations: Secondary | ICD-10-CM | POA: Diagnosis not present

## 2023-02-04 DIAGNOSIS — R0602 Shortness of breath: Secondary | ICD-10-CM | POA: Diagnosis not present

## 2023-02-04 DIAGNOSIS — I739 Peripheral vascular disease, unspecified: Secondary | ICD-10-CM | POA: Diagnosis not present

## 2023-02-04 DIAGNOSIS — I499 Cardiac arrhythmia, unspecified: Secondary | ICD-10-CM | POA: Diagnosis not present

## 2023-02-06 DIAGNOSIS — G4733 Obstructive sleep apnea (adult) (pediatric): Secondary | ICD-10-CM | POA: Diagnosis not present

## 2023-02-13 DIAGNOSIS — G4733 Obstructive sleep apnea (adult) (pediatric): Secondary | ICD-10-CM | POA: Diagnosis not present

## 2023-04-10 ENCOUNTER — Other Ambulatory Visit: Payer: Self-pay | Admitting: Gastroenterology

## 2023-04-10 DIAGNOSIS — K219 Gastro-esophageal reflux disease without esophagitis: Secondary | ICD-10-CM

## 2023-04-17 ENCOUNTER — Ambulatory Visit
Admission: RE | Admit: 2023-04-17 | Discharge: 2023-04-17 | Disposition: A | Discharge status: Home / Self Care | Payer: 59 | Source: Ambulatory Visit | Attending: Gastroenterology | Admitting: Gastroenterology

## 2023-04-17 DIAGNOSIS — K219 Gastro-esophageal reflux disease without esophagitis: Secondary | ICD-10-CM

## 2023-04-23 ENCOUNTER — Other Ambulatory Visit: Payer: Self-pay | Admitting: Gastroenterology

## 2023-04-23 DIAGNOSIS — R160 Hepatomegaly, not elsewhere classified: Secondary | ICD-10-CM

## 2023-05-01 ENCOUNTER — Ambulatory Visit
Admission: RE | Admit: 2023-05-01 | Discharge: 2023-05-01 | Disposition: A | Discharge status: Home / Self Care | Payer: 59 | Source: Ambulatory Visit | Attending: Gastroenterology | Admitting: Gastroenterology

## 2023-05-01 DIAGNOSIS — R160 Hepatomegaly, not elsewhere classified: Secondary | ICD-10-CM | POA: Diagnosis present

## 2023-05-01 MED ORDER — GADOBUTROL 1 MMOL/ML IV SOLN
10.0000 mL | Freq: Once | INTRAVENOUS | Status: AC | PRN
  Administered 2023-05-01: 10 mL via INTRAVENOUS

## 2023-06-27 ENCOUNTER — Encounter: Payer: Self-pay | Admitting: *Deleted

## 2023-06-30 ENCOUNTER — Encounter: Payer: Self-pay | Admitting: *Deleted

## 2023-07-07 ENCOUNTER — Encounter: Payer: Self-pay | Admitting: Emergency Medicine

## 2023-07-08 ENCOUNTER — Ambulatory Visit: Payer: 59 | Admitting: Certified Registered"

## 2023-07-08 ENCOUNTER — Encounter
Admission: RE | Disposition: A | Discharge status: Home / Self Care | Payer: Self-pay | Source: Home / Self Care | Attending: Gastroenterology

## 2023-07-08 ENCOUNTER — Ambulatory Visit
Admission: RE | Admit: 2023-07-08 | Discharge: 2023-07-08 | Disposition: A | Discharge status: Home / Self Care | Payer: 59 | Attending: Gastroenterology | Admitting: Gastroenterology

## 2023-07-08 ENCOUNTER — Encounter: Payer: Self-pay | Admitting: *Deleted

## 2023-07-08 DIAGNOSIS — K449 Diaphragmatic hernia without obstruction or gangrene: Secondary | ICD-10-CM | POA: Insufficient documentation

## 2023-07-08 DIAGNOSIS — K2100 Gastro-esophageal reflux disease with esophagitis, without bleeding: Secondary | ICD-10-CM | POA: Diagnosis not present

## 2023-07-08 DIAGNOSIS — Z1211 Encounter for screening for malignant neoplasm of colon: Secondary | ICD-10-CM | POA: Diagnosis present

## 2023-07-08 DIAGNOSIS — M1990 Unspecified osteoarthritis, unspecified site: Secondary | ICD-10-CM | POA: Diagnosis not present

## 2023-07-08 DIAGNOSIS — Z8719 Personal history of other diseases of the digestive system: Secondary | ICD-10-CM | POA: Diagnosis not present

## 2023-07-08 DIAGNOSIS — G4730 Sleep apnea, unspecified: Secondary | ICD-10-CM | POA: Diagnosis not present

## 2023-07-08 DIAGNOSIS — I739 Peripheral vascular disease, unspecified: Secondary | ICD-10-CM | POA: Insufficient documentation

## 2023-07-08 DIAGNOSIS — D124 Benign neoplasm of descending colon: Secondary | ICD-10-CM | POA: Insufficient documentation

## 2023-07-08 DIAGNOSIS — E669 Obesity, unspecified: Secondary | ICD-10-CM | POA: Insufficient documentation

## 2023-07-08 DIAGNOSIS — K640 First degree hemorrhoids: Secondary | ICD-10-CM | POA: Diagnosis not present

## 2023-07-08 DIAGNOSIS — D123 Benign neoplasm of transverse colon: Secondary | ICD-10-CM | POA: Insufficient documentation

## 2023-07-08 DIAGNOSIS — I10 Essential (primary) hypertension: Secondary | ICD-10-CM | POA: Diagnosis not present

## 2023-07-08 DIAGNOSIS — Z800 Family history of malignant neoplasm of digestive organs: Secondary | ICD-10-CM | POA: Diagnosis not present

## 2023-07-08 DIAGNOSIS — Z860101 Personal history of adenomatous and serrated colon polyps: Secondary | ICD-10-CM | POA: Insufficient documentation

## 2023-07-08 HISTORY — DX: Other intervertebral disc degeneration, lumbar region without mention of lumbar back pain or lower extremity pain: M51.369

## 2023-07-08 HISTORY — DX: Dyspnea, unspecified: R06.00

## 2023-07-08 HISTORY — DX: Other intervertebral disc displacement, lumbar region: M51.26

## 2023-07-08 HISTORY — PX: POLYPECTOMY: SHX5525

## 2023-07-08 HISTORY — PX: COLONOSCOPY WITH PROPOFOL: SHX5780

## 2023-07-08 HISTORY — DX: Other seasonal allergic rhinitis: J30.2

## 2023-07-08 HISTORY — DX: Obesity, unspecified: E66.9

## 2023-07-08 HISTORY — DX: Peripheral vascular disease, unspecified: I73.9

## 2023-07-08 HISTORY — DX: Deficiency of other specified B group vitamins: E53.8

## 2023-07-08 HISTORY — DX: Personal history of colon polyps, unspecified: Z86.0100

## 2023-07-08 HISTORY — PX: ESOPHAGOGASTRODUODENOSCOPY (EGD) WITH PROPOFOL: SHX5813

## 2023-07-08 HISTORY — DX: Cardiac arrhythmia, unspecified: I49.9

## 2023-07-08 HISTORY — DX: Sleep apnea, unspecified: G47.30

## 2023-07-08 HISTORY — DX: Radiculopathy, lumbar region: M54.16

## 2023-07-08 HISTORY — DX: Plantar fascial fibromatosis: M72.2

## 2023-07-08 HISTORY — DX: Hyperlipidemia, unspecified: E78.5

## 2023-07-08 SURGERY — COLONOSCOPY WITH PROPOFOL
Anesthesia: General

## 2023-07-08 MED ORDER — SODIUM CHLORIDE 0.9 % IV SOLN: INTRAVENOUS | Status: DC

## 2023-07-08 MED ORDER — LIDOCAINE HCL (CARDIAC) PF 100 MG/5ML IV SOSY
PREFILLED_SYRINGE | INTRAVENOUS | Status: DC | PRN
  Administered 2023-07-08: 40 mg via INTRAVENOUS

## 2023-07-08 MED ORDER — PROPOFOL 500 MG/50ML IV EMUL
INTRAVENOUS | Status: DC | PRN
  Administered 2023-07-08: 175 ug/kg/min via INTRAVENOUS

## 2023-07-08 MED ORDER — PROPOFOL 10 MG/ML IV BOLUS
INTRAVENOUS | Status: DC | PRN
Start: 2023-07-08 — End: 2023-07-08
  Administered 2023-07-08: 70 mg via INTRAVENOUS
  Administered 2023-07-08: 30 mg via INTRAVENOUS
  Administered 2023-07-08: 50 mg via INTRAVENOUS
  Administered 2023-07-08: 80 mg via INTRAVENOUS

## 2023-07-08 MED ORDER — GLYCOPYRROLATE 0.2 MG/ML IJ SOLN
INTRAMUSCULAR | Status: DC | PRN
  Administered 2023-07-08 (×2): .1 mg via INTRAVENOUS

## 2023-07-08 MED ORDER — SODIUM CHLORIDE 0.9% FLUSH: 10.0000 mL | Freq: Two times a day (BID) | INTRAVENOUS | Status: DC

## 2023-07-08 NOTE — Transfer of Care (Signed)
Immediate Anesthesia Transfer of Care Note  Patient: Dylan Hendrix  Procedure(s) Performed: COLONOSCOPY WITH PROPOFOL ESOPHAGOGASTRODUODENOSCOPY (EGD) WITH PROPOFOL POLYPECTOMY  Patient Location: Endoscopy Unit  Anesthesia Type:General  Level of Consciousness: awake, drowsy, and patient cooperative  Airway & Oxygen Therapy: Patient Spontanous Breathing and Patient connected to nasal cannula oxygen  Post-op Assessment: Report given to RN, Post -op Vital signs reviewed and stable, and Patient moving all extremities X 4  Post vital signs: Reviewed and stable  Last Vitals:  Vitals Value Taken Time  BP    Temp    Pulse    Resp    SpO2      Last Pain:  Vitals:   07/08/23 0807  TempSrc: Temporal         Complications: No notable events documented.

## 2023-07-08 NOTE — Interval H&P Note (Signed)
History and Physical Interval Note:  07/08/2023 8:39 AM  Dylan Hendrix  has presented today for surgery, with the diagnosis of GERD,Chronic diarrhea,hx of adenomatous polyp, hx of barrett;s esophagus.  The various methods of treatment have been discussed with the patient and family. After consideration of risks, benefits and other options for treatment, the patient has consented to  Procedure(s): COLONOSCOPY WITH PROPOFOL (N/A) ESOPHAGOGASTRODUODENOSCOPY (EGD) WITH PROPOFOL (N/A) as a surgical intervention.  The patient's history has been reviewed, patient examined, no change in status, stable for surgery.  I have reviewed the patient's chart and labs.  Questions were answered to the patient's satisfaction.     Regis Bill  Ok to proceed with EGD/Colonoscopy

## 2023-07-08 NOTE — H&P (Signed)
Outpatient short stay form Pre-procedure 07/08/2023  Regis Bill, MD  Primary Physician: [facility]  Reason for visit:  BE and history of polyps  History of present illness:    62 y/o gentleman with history of obesity, arthritis on NSAIDS, BE on PPI, and hypertension here for EGD for reported history of BE and colonoscopy for history of adenomatous polyps. Last colonoscopy in 2019 unremarkable. History of inguinal hernia repair. Father with esophageal cancer. No blood thinners.    Current Facility-Administered Medications:    0.9 %  sodium chloride infusion, , Intravenous, Continuous, Braylee Lal, Rossie Muskrat, MD, Last Rate: 40 mL/hr at 07/08/23 0825, New Bag at 07/08/23 0825   sodium chloride flush (NS) 0.9 % injection 10 mL, 10 mL, Intravenous, Q12H, Kammi Hechler, Rossie Muskrat, MD  Medications Prior to Admission  Medication Sig Dispense Refill Last Dose   cetirizine (ZYRTEC) 10 MG tablet Take 10 mg by mouth daily.   07/07/2023   cyanocobalamin 1000 MCG tablet Take 1,000 mcg by mouth daily.      etodolac (LODINE) 500 MG tablet Take 500 mg by mouth 2 (two) times daily.   07/07/2023   meloxicam (MOBIC) 7.5 MG tablet Take 15 mg by mouth daily.   07/07/2023   omeprazole (PRILOSEC) 20 MG capsule Take 20 mg by mouth daily.   07/07/2023   alendronate (FOSAMAX) 70 MG tablet Take 70 mg by mouth once a week.      Cholecalciferol (VITAMIN D3 PO) Take by mouth.      HYDROcodone-acetaminophen (NORCO/VICODIN) 5-325 MG tablet Take by mouth.      traMADol (ULTRAM) 50 MG tablet Take by mouth.        Allergies  Allergen Reactions   Penicillin V Potassium Other (See Comments)    Told had allergy as a child. Doesn't know what the reaction was.     Past Medical History:  Diagnosis Date   Arthritis    Asthma    AS A CHILD   Bilateral carpal tunnel syndrome 08/2019   patient to have right hand release in office on 09/01/19   Cancer [facility])    DDD (degenerative disc disease), lumbar     Dyspnea    Dysrhythmia    Effusion of knee joint, left 08/2019   GERD (gastroesophageal reflux disease)    History of Barrett's esophagus    History of colon polyps    History of tongue cancer    HNP (herniated nucleus pulposus), lumbar    Hyperlipidemia    Lumbar radiculitis    Obesity    Peripheral vascular disease ([facility])    Plantar fibromatosis    Seasonal allergies    Sleep apnea    Spondylosis of lumbar region without myelopathy or radiculopathy    Vitamin B 12 deficiency     Review of systems:  Otherwise negative.    Physical Exam  Gen: Alert, oriented. Appears stated age.  HEENT: PERRLA. Lungs: No respiratory distress CV: RRR Abd: soft, benign, no masses Ext: No edema    Planned procedures: Proceed with EGD/colonoscopy. The patient understands the nature of the planned procedure, indications, risks, alternatives and potential complications including but not limited to bleeding, infection, perforation, damage to internal organs and possible oversedation/side effects from anesthesia. The patient agrees and gives consent to proceed.  Please refer to procedure notes for findings, recommendations and patient disposition/instructions.     Regis Bill, MD [facility]

## 2023-07-08 NOTE — Anesthesia Postprocedure Evaluation (Signed)
Anesthesia Post Note  Patient: Dylan Hendrix  Procedure(s) Performed: COLONOSCOPY WITH PROPOFOL ESOPHAGOGASTRODUODENOSCOPY (EGD) WITH PROPOFOL POLYPECTOMY  Patient location during evaluation: Endoscopy Anesthesia Type: General Level of consciousness: awake and alert Pain management: pain level controlled Vital Signs Assessment: post-procedure vital signs reviewed and stable Respiratory status: spontaneous breathing, nonlabored ventilation, respiratory function stable and patient connected to nasal cannula oxygen Cardiovascular status: blood pressure returned to baseline and stable Postop Assessment: no apparent nausea or vomiting Anesthetic complications: no   No notable events documented.   Last Vitals:  Vitals:   07/08/23 0807 07/08/23 0912  BP: (!) 155/101 109/76  Pulse: 62 (!) 105  Resp: 18 20  Temp: (!) 35.9 C (!) 36.1 C  SpO2: 100% 99%    Last Pain:  Vitals:   07/08/23 0912  TempSrc: Temporal  PainSc: 0-No pain                 Corinda Gubler

## 2023-07-08 NOTE — Op Note (Signed)
[facility] Patient Name: Dylan Hendrix Procedure Date: 07/08/2023 8:13 AM MRN: [id number] Account #: [id number] Date of Birth: 1961/08/16 Admit Type: Outpatient Age: 62 Room: [facility] ENDO ROOM 1 Gender: Male Note Status: Finalized Instrument Name: Upper Endoscope [id number] Procedure:             Upper GI endoscopy Indications:           Gastro-esophageal reflux disease, Suspected Barrett's                         esophagus Providers:             Eather Colas MD, MD Referring MD:          No Local Md, MD (Referring MD) Medicines:             Monitored Anesthesia Care Complications:         No immediate complications. Procedure:             Pre-Anesthesia Assessment:                        - Prior to the procedure, a History and Physical was                         performed, and patient medications and allergies were                         reviewed. The patient is competent. The risks and                         benefits of the procedure and the sedation options and                         risks were discussed with the patient. All questions                         were answered and informed consent was obtained.                         Patient identification and proposed procedure were                         verified by the physician, the nurse, the                         anesthesiologist, the anesthetist and the technician                         in the endoscopy suite. Mental Status Examination:                         alert and oriented. Airway Examination: normal                         oropharyngeal airway and neck mobility. Respiratory                         Examination: clear to auscultation. CV Examination:  normal. Prophylactic Antibiotics: The patient does not                         require prophylactic antibiotics. Prior                         Anticoagulants: The patient has taken no anticoagulant                          or antiplatelet agents. ASA Grade Assessment: II - A                         patient with mild systemic disease. After reviewing                         the risks and benefits, the patient was deemed in                         satisfactory condition to undergo the procedure. The                         anesthesia plan was to use monitored anesthesia care                         (MAC). Immediately prior to administration of                         medications, the patient was re-assessed for adequacy                         to receive sedatives. The heart rate, respiratory                         rate, oxygen saturations, blood pressure, adequacy of                         pulmonary ventilation, and response to care were                         monitored throughout the procedure. The physical                         status of the patient was re-assessed after the                         procedure.                        After obtaining informed consent, the endoscope was                         passed under direct vision. Throughout the procedure,                         the patient's blood pressure, pulse, and oxygen                         saturations were monitored continuously. The Endoscope  was introduced through the mouth, and advanced to the                         second part of duodenum. The upper GI endoscopy was                         accomplished without difficulty. The patient tolerated                         the procedure well. Findings:      The esophagus and gastroesophageal junction were examined with white       light and narrow band imaging (NBI). There was no visual evidence of       Barrett's esophagus.      A small hiatal hernia was present.      The entire examined stomach was normal.      The examined duodenum was normal. Impression:            - There is no endoscopic evidence of Barrett's                         esophagus.                         - Small hiatal hernia.                        - Normal stomach.                        - Normal examined duodenum.                        - No specimens collected. Recommendation:        - Discharge patient to home.                        - Resume previous diet.                        - Continue present medications.                        - Return to referring physician as previously                         scheduled. Procedure Code(s):     --- Professional ---                        [phone], Esophagogastroduodenoscopy, flexible,                         transoral; diagnostic, including collection of                         specimen(s) by brushing or washing, when performed                         (separate procedure) Diagnosis Code(s):     --- Professional ---  K44.9, Diaphragmatic hernia without obstruction or                         gangrene                        K21.9, Gastro-esophageal reflux disease without                         esophagitis CPT copyright 2022 American [employer]. All rights reserved. The codes documented in this report are preliminary and upon coder review may  be revised to meet current compliance requirements. Eather Colas MD, MD 07/08/2023 9:11:05 AM Number of Addenda: 0 Note Initiated On: 07/08/2023 8:13 AM Estimated Blood Loss:  Estimated blood loss: none.      [facility]

## 2023-07-08 NOTE — Anesthesia Preprocedure Evaluation (Signed)
Anesthesia Evaluation  Patient identified by MRN, date of birth, ID band Patient awake    Reviewed: Allergy & Precautions, NPO status , Patient's Chart, lab work & pertinent test results  History of Anesthesia Complications Negative for: history of anesthetic complications  Airway Mallampati: II  TM Distance: >3 FB Neck ROM: Full    Dental no notable dental hx. (+) Teeth Intact   Pulmonary asthma , sleep apnea , neg COPD, Patient abstained from smoking.Not current smoker, former smoker   Pulmonary exam normal breath sounds clear to auscultation       Cardiovascular Exercise Tolerance: Good METS(-) hypertension+ Peripheral Vascular Disease  (-) CAD and (-) Past MI + dysrhythmias  Rhythm:Regular Rate:Normal - Systolic murmurs    Neuro/Psych negative neurological ROS  negative psych ROS   GI/Hepatic ,GERD  ,,(+)     (-) substance abuse    Endo/Other  neg diabetes    Renal/GU negative Renal ROS     Musculoskeletal   Abdominal   Peds  Hematology   Anesthesia Other Findings Past Medical History: No date: Arthritis No date: Asthma     Comment:  AS A CHILD 08/2019: Bilateral carpal tunnel syndrome     Comment:  patient to have right hand release in office on 09/01/19 No date: Cancer (HCC) No date: DDD (degenerative disc disease), lumbar No date: Dyspnea No date: Dysrhythmia 08/2019: Effusion of knee joint, left No date: GERD (gastroesophageal reflux disease) No date: History of Barrett's esophagus No date: History of colon polyps No date: History of tongue cancer No date: HNP (herniated nucleus pulposus), lumbar No date: Hyperlipidemia No date: Lumbar radiculitis No date: Obesity No date: Peripheral vascular disease (HCC) No date: Plantar fibromatosis No date: Seasonal allergies No date: Sleep apnea No date: Spondylosis of lumbar region without myelopathy or  radiculopathy No date: Vitamin B 12  deficiency  Reproductive/Obstetrics                             Anesthesia Physical Anesthesia Plan  ASA: 2  Anesthesia Plan: General   Post-op Pain Management: Minimal or no pain anticipated   Induction: Intravenous  PONV Risk Score and Plan: 3 and Propofol infusion, TIVA and Ondansetron  Airway Management Planned: Nasal Cannula  Additional Equipment: None  Intra-op Plan:   Post-operative Plan:   Informed Consent: I have reviewed the patients History and Physical, chart, labs and discussed the procedure including the risks, benefits and alternatives for the proposed anesthesia with the patient or authorized representative who has indicated his/her understanding and acceptance.     Dental advisory given  Plan Discussed with: CRNA and Surgeon  Anesthesia Plan Comments: (Discussed risks of anesthesia with patient, including possibility of difficulty with spontaneous ventilation under anesthesia necessitating airway intervention, PONV, and rare risks such as cardiac or respiratory or neurological events, and allergic reactions. Discussed the role of CRNA in patient's perioperative care. Patient understands.)       Anesthesia Quick Evaluation

## 2023-07-08 NOTE — Op Note (Signed)
[facility] Patient Name: Dylan Hendrix Procedure Date: 07/08/2023 8:12 AM MRN: [id number] Account #: [id number] Date of Birth: 12/18/1960 Admit Type: Outpatient Age: 62 Room: [facility] ENDO ROOM 1 Gender: Male Note Status: Finalized Instrument Name: Prentice Docker [id number] Procedure:             Colonoscopy Indications:           High risk colon cancer surveillance: Personal history                         of colonic polyps, Last colonoscopy 5 years ago Providers:             Eather Colas MD, MD Referring MD:          No Local Md, MD (Referring MD) Medicines:             Monitored Anesthesia Care Complications:         No immediate complications. Estimated blood loss:                         Minimal. Procedure:             Pre-Anesthesia Assessment:                        - Prior to the procedure, a History and Physical was                         performed, and patient medications and allergies were                         reviewed. The patient is competent. The risks and                         benefits of the procedure and the sedation options and                         risks were discussed with the patient. All questions                         were answered and informed consent was obtained.                         Patient identification and proposed procedure were                         verified by the physician, the nurse, the                         anesthesiologist, the anesthetist and the technician                         in the endoscopy suite. Mental Status Examination:                         alert and oriented. Airway Examination: normal                         oropharyngeal airway and neck mobility. Respiratory  Examination: clear to auscultation. CV Examination:                         normal. Prophylactic Antibiotics: The patient does not                         require prophylactic antibiotics. Prior                          Anticoagulants: The patient has taken no anticoagulant                         or antiplatelet agents. ASA Grade Assessment: II - A                         patient with mild systemic disease. After reviewing                         the risks and benefits, the patient was deemed in                         satisfactory condition to undergo the procedure. The                         anesthesia plan was to use monitored anesthesia care                         (MAC). Immediately prior to administration of                         medications, the patient was re-assessed for adequacy                         to receive sedatives. The heart rate, respiratory                         rate, oxygen saturations, blood pressure, adequacy of                         pulmonary ventilation, and response to care were                         monitored throughout the procedure. The physical                         status of the patient was re-assessed after the                         procedure.                        After obtaining informed consent, the colonoscope was                         passed under direct vision. Throughout the procedure,                         the patient's blood pressure, pulse, and oxygen  saturations were monitored continuously. The                         Colonoscope was introduced through the anus and                         advanced to the the cecum, identified by appendiceal                         orifice and ileocecal valve. The colonoscopy was                         performed without difficulty. The patient tolerated                         the procedure well. The quality of the bowel                         preparation was good. The ileocecal valve, appendiceal                         orifice, and rectum were photographed. Findings:      The perianal and digital rectal examinations were normal.      Two sessile polyps were found in  the transverse colon. The polyps were 2       to 4 mm in size. These polyps were removed with a cold snare. Resection       and retrieval were complete. Estimated blood loss was minimal.      A 2 mm polyp was found in the descending colon. The polyp was sessile.       The polyp was removed with a cold snare. Resection and retrieval were       complete. Estimated blood loss was minimal.      Internal hemorrhoids were found during retroflexion. The hemorrhoids       were Grade I (internal hemorrhoids that do not prolapse).      The exam was otherwise without abnormality on direct and retroflexion       views. Impression:            - Two 2 to 4 mm polyps in the transverse colon,                         removed with a cold snare. Resected and retrieved.                        - One 2 mm polyp in the descending colon, removed with                         a cold snare. Resected and retrieved.                        - Internal hemorrhoids.                        - The examination was otherwise normal on direct and                         retroflexion views. Recommendation:        -  Discharge patient to home.                        - Resume previous diet.                        - Continue present medications.                        - Await pathology results.                        - Repeat colonoscopy date to be determined after                         pending pathology results are reviewed for                         surveillance.                        - Return to referring physician as previously                         scheduled. Procedure Code(s):     --- Professional ---                        [phone], Colonoscopy, flexible; with removal of                         tumor(s), polyp(s), or other lesion(s) by snare                         technique Diagnosis Code(s):     --- Professional ---                        Z86.010, Personal history of colonic polyps                        D12.3,  Benign neoplasm of transverse colon (hepatic                         flexure or splenic flexure)                        D12.4, Benign neoplasm of descending colon                        K64.0, First degree hemorrhoids CPT copyright 2022 American Medical Association. All rights reserved. The codes documented in this report are preliminary and upon coder review may  be revised to meet current compliance requirements. Eather Colas MD, MD 07/08/2023 9:13:42 AM Number of Addenda: 0 Note Initiated On: 07/08/2023 8:12 AM Scope Withdrawal Time: 0 hours 11 minutes 20 seconds  Total Procedure Duration: 0 hours 14 minutes 47 seconds  Estimated Blood Loss:  Estimated blood loss was minimal.      [facility]

## 2023-07-09 ENCOUNTER — Encounter: Payer: Self-pay | Admitting: Gastroenterology

## 2023-07-09 LAB — SURGICAL PATHOLOGY

## 2023-12-30 ENCOUNTER — Ambulatory Visit: Admitting: Urology
